# Patient Record
Sex: Female | Born: 1960 | Race: Black or African American | Hispanic: No | Marital: Single | Smoking: Former smoker
Health system: Southern US, Community
[De-identification: ages and names within clinical notes are randomized; demographics above are authoritative.]

## PROBLEM LIST (undated history)

## (undated) DIAGNOSIS — T7840XA Allergy, unspecified, initial encounter: Secondary | ICD-10-CM

## (undated) DIAGNOSIS — R87619 Unspecified abnormal cytological findings in specimens from cervix uteri: Secondary | ICD-10-CM

## (undated) DIAGNOSIS — R0789 Other chest pain: Secondary | ICD-10-CM

## (undated) DIAGNOSIS — M1990 Unspecified osteoarthritis, unspecified site: Secondary | ICD-10-CM

## (undated) DIAGNOSIS — I359 Nonrheumatic aortic valve disorder, unspecified: Secondary | ICD-10-CM

## (undated) DIAGNOSIS — R0602 Shortness of breath: Secondary | ICD-10-CM

## (undated) DIAGNOSIS — I341 Nonrheumatic mitral (valve) prolapse: Secondary | ICD-10-CM

## (undated) DIAGNOSIS — R002 Palpitations: Secondary | ICD-10-CM

## (undated) DIAGNOSIS — I10 Essential (primary) hypertension: Secondary | ICD-10-CM

## (undated) DIAGNOSIS — I351 Nonrheumatic aortic (valve) insufficiency: Secondary | ICD-10-CM

## (undated) DIAGNOSIS — IMO0002 Reserved for concepts with insufficient information to code with codable children: Secondary | ICD-10-CM

## (undated) DIAGNOSIS — E785 Hyperlipidemia, unspecified: Secondary | ICD-10-CM

## (undated) DIAGNOSIS — K5090 Crohn's disease, unspecified, without complications: Secondary | ICD-10-CM

## (undated) DIAGNOSIS — K589 Irritable bowel syndrome without diarrhea: Secondary | ICD-10-CM

## (undated) HISTORY — DX: Nonrheumatic mitral (valve) prolapse: I34.1

## (undated) HISTORY — DX: Allergy, unspecified, initial encounter: T78.40XA

## (undated) HISTORY — PX: LAPAROSCOPIC SUPRACERVICAL HYSTERECTOMY: SUR797

## (undated) HISTORY — DX: Reserved for concepts with insufficient information to code with codable children: IMO0002

## (undated) HISTORY — DX: Hyperlipidemia, unspecified: E78.5

## (undated) HISTORY — DX: Other chest pain: R07.89

## (undated) HISTORY — DX: Shortness of breath: R06.02

## (undated) HISTORY — DX: Nonrheumatic aortic valve disorder, unspecified: I35.9

## (undated) HISTORY — DX: Irritable bowel syndrome without diarrhea: K58.9

## (undated) HISTORY — DX: Unspecified osteoarthritis, unspecified site: M19.90

## (undated) HISTORY — DX: Nonrheumatic aortic (valve) insufficiency: I35.1

## (undated) HISTORY — PX: BLADDER SUSPENSION: SHX72

## (undated) HISTORY — DX: Crohn's disease, unspecified, without complications: K50.90

## (undated) HISTORY — DX: Essential (primary) hypertension: I10

## (undated) HISTORY — DX: Unspecified abnormal cytological findings in specimens from cervix uteri: R87.619

## (undated) HISTORY — PX: ABSCESS DRAINAGE: SHX1119

## (undated) HISTORY — DX: Palpitations: R00.2

## (undated) HISTORY — PX: VAGINAL HYSTERECTOMY: SUR661

---

## 1993-05-31 DIAGNOSIS — R87619 Unspecified abnormal cytological findings in specimens from cervix uteri: Secondary | ICD-10-CM

## 1993-05-31 HISTORY — DX: Unspecified abnormal cytological findings in specimens from cervix uteri: R87.619

## 2010-12-10 ENCOUNTER — Ambulatory Visit (HOSPITAL_COMMUNITY)
Admission: RE | Admit: 2010-12-10 | Discharge: 2010-12-10 | Disposition: A | Discharge status: Home / Self Care | Payer: Self-pay | Source: Ambulatory Visit | Attending: Internal Medicine | Admitting: Internal Medicine

## 2010-12-10 ENCOUNTER — Other Ambulatory Visit (HOSPITAL_COMMUNITY): Payer: Self-pay | Admitting: Internal Medicine

## 2010-12-10 DIAGNOSIS — R0789 Other chest pain: Secondary | ICD-10-CM | POA: Insufficient documentation

## 2010-12-10 DIAGNOSIS — R52 Pain, unspecified: Secondary | ICD-10-CM

## 2010-12-10 DIAGNOSIS — R0602 Shortness of breath: Secondary | ICD-10-CM | POA: Insufficient documentation

## 2010-12-10 DIAGNOSIS — R42 Dizziness and giddiness: Secondary | ICD-10-CM | POA: Insufficient documentation

## 2011-01-12 ENCOUNTER — Emergency Department (HOSPITAL_COMMUNITY)
Admission: EM | Admit: 2011-01-12 | Discharge: 2011-01-12 | Disposition: A | Discharge status: Home / Self Care | Payer: Self-pay | Attending: Emergency Medicine | Admitting: Emergency Medicine

## 2011-01-12 DIAGNOSIS — Z79899 Other long term (current) drug therapy: Secondary | ICD-10-CM | POA: Insufficient documentation

## 2011-01-12 DIAGNOSIS — K5090 Crohn's disease, unspecified, without complications: Secondary | ICD-10-CM | POA: Insufficient documentation

## 2011-01-12 DIAGNOSIS — I10 Essential (primary) hypertension: Secondary | ICD-10-CM | POA: Insufficient documentation

## 2011-01-12 DIAGNOSIS — N949 Unspecified condition associated with female genital organs and menstrual cycle: Secondary | ICD-10-CM | POA: Insufficient documentation

## 2011-01-12 DIAGNOSIS — N9489 Other specified conditions associated with female genital organs and menstrual cycle: Secondary | ICD-10-CM | POA: Insufficient documentation

## 2011-01-12 LAB — GLUCOSE, CAPILLARY: Glucose-Capillary: 85 mg/dL (ref 70–99)

## 2011-01-12 NOTE — Consult Note (Signed)
Reason for Consult:Left labial mass/swelling Referring Physician: ER  Carly Paul is an 50 y.o. female with a 10 day history of increasing tenderness and fullness just inside the vagina on the left.  Patient reports about 10 days ago she saw here PCP and was treated with Doxycycline.  She has completed the antibiotics and now the swelling is increasing again.  It is not red or hot to the touch like it was about 10 days ago.  It is also not nearly as tender as it was before.  She reports a history of bartholin's abscess in the past.  She has had at least two I&D's on this same side.    Pertinent Gynecological History: Menses: prior hysterecomy Bleeding: none Contraception: status post hysterectomy DES exposure: denies Blood transfusions: none Sexually transmitted diseases: denies Previous GYN Procedures: I&D of bartholin's abscess x 2 on the left  Last mammogram: unknown Date: unknown Last pap: not necessary due to prior hysterectomy Date: N/A    Menstrual History: Menarche age: unknown No LMP recorded. Patient has had a hysterectomy.    No past medical history on file.  Patient reports history of HTN and Crohn's disease  No past surgical history on file.  Patient reports hysterectomy.  Also reports having a bladder repair due to prolapse.  She has has several abscesses drained due to her Crohn's.  [place]  Until that time she was managed at [facility]  No family history on file.  NO gyn malignancies in family.  Social History:   Quit smoking 7 months ago  Allergies:  Sulfa  Medications: lisonopril  Review of Systems  Constitutional: Negative for fever and chills.  HENT: Negative for neck pain.   Eyes: Negative for blurred vision.  Respiratory: Negative for cough and shortness of breath.   Cardiovascular: Negative for chest pain.  Gastrointestinal: Positive for nausea and constipation. Negative for heartburn and vomiting.   Nausea is unrelated to current problem.  She has experienced this off and on for months.  ALso, constipation is not uncommon for patient.  Genitourinary: Negative for dysuria, urgency and frequency.  Musculoskeletal: Negative for myalgias.  Skin: Negative for rash.  Neurological: Negative for dizziness and headaches.  Endo/Heme/Allergies: Does not bruise/bleed easily.  Psychiatric/Behavioral: Negative for depression.    There were no vitals taken for this visit. Physical Exam  Constitutional: She is oriented to person, place, and time. She appears well-developed and well-nourished.  HENT:  Head: Normocephalic and atraumatic.  Neck: Normal range of motion.  Cardiovascular: Normal rate and regular rhythm.   Respiratory: Effort normal and breath sounds normal.  GI: Soft. Bowel sounds are normal. She exhibits no distension and no mass. There is no tenderness. There is no rebound and no guarding. Hernia confirmed negative in the right inguinal area and confirmed negative in the left inguinal area.  Genitourinary: Vagina normal.    There is no rash, tenderness, lesion or injury on the right labia. There is tenderness and lesion on the left labia. There is no rash or injury on the left labia. No tenderness or bleeding around the vagina. No foreign body around the vagina. No vaginal discharge found.       Location of bartholin's gland abscess is just inside left side of vagina.  There is now a labial hematoma on the left from the ER MD's attempt to I&D the abscess.  Absent cervix and uterus  Musculoskeletal: Normal range of motion. She exhibits no  edema.  Lymphadenopathy:       Right: No inguinal adenopathy present.       Left: No inguinal adenopathy present.  Neurological: She is alert and oriented to person, place, and time.  Skin: Skin is warm and dry.  Psychiatric: She has a normal mood and affect.    Patient was counseled about findings.  I&D recommended.  Risks including infection and  re-occurrence discussed.  Patient agreed to proceed.    Area cleansed with betadine x 3.  1/2 cc 2% Xylocaine instilled in lesion.  Aspiration performed and frank pus noted.  With no. 11 blade, bartholin's abscess opened.  Pus and blood drained.   Gland milked to ensure complete drainage.  No loculations noted with sterile Q tip.  Procedure ended.  Patient was uncomfortable with procedure but felt much better immediately after procedure ended.  Assessment/Plan: Bartholin's abscess s/p I&D in ER setting.  Left labial hematoma due to prior attempt at I&D from ER MD. 1.  Keflex 500mg  qid x 10 days. 2.  Vicodin 5/500 1-2 po q 4-6 hrs prn pain. 3.  Recheck in 48 hrs.  Will contact patient with follow-up information. 4.  Ice pack this afternoon for hematoma.  Then patient will start BID epson salt tub baths or warm compressed TID.  Kc Sedlak,M SUZANNE 01/12/2011

## 2011-03-12 ENCOUNTER — Ambulatory Visit (INDEPENDENT_AMBULATORY_CARE_PROVIDER_SITE_OTHER): Payer: Self-pay | Admitting: Advanced Practice Midwife

## 2011-03-12 ENCOUNTER — Encounter: Payer: Self-pay | Admitting: Advanced Practice Midwife

## 2011-03-12 DIAGNOSIS — N751 Abscess of Bartholin's gland: Secondary | ICD-10-CM

## 2011-03-12 DIAGNOSIS — K5090 Crohn's disease, unspecified, without complications: Secondary | ICD-10-CM

## 2011-03-12 NOTE — Progress Notes (Signed)
  Subjective:    Patient ID: Carly Paul, female    DOB: Feb 04, 1961, 50 y.o.   MRN: [id number]  HPI  Presents for consultation regarding recurrent Bartholins abscesses. She was seen by Dr Hyacinth Meeker in the ED for a recent I&D of a left abscess. Her last one was in 2009.   Her history is remarkable for Crohns Disease.  She has had several abscesses over the past years. She reports quick recovery after the most recent occurrence. Surgical history is remarkable for Hysterectomy and bladder repair. Previous care at [facility].  Does report some hot flashes and vaginal dryness recently .  There is some literature that suggests Bartholins may present in Crohns disease.  WikiOutlook.ch Http://www.[email or web address]://www.huang.com/ http://ukpmc.ac.uk/articles/PMC1901720/reload=0;jsessionid=FHNwGEaMBN3cT10fs3hxN.0  Review of Systems  Genitourinary: Negative for vaginal bleeding, vaginal discharge, difficulty urinating and vaginal pain.       Objective:   Physical Exam  Constitutional: She is oriented to person, place, and time. She appears well-developed and well-nourished.  HENT:  Head: Normocephalic.  Pulmonary/Chest: Effort normal.  Abdominal: Soft. She exhibits no distension. There is no tenderness. There is no rebound and no guarding.  Genitourinary: Vagina normal. No vaginal discharge found.       EGBUS:  No evidence of Bartholinitis. No Word Catheter used in recent drainage. Healed scar on left buttocks near vagina from abscess that drained itself..  Some atrophic changes and minimal lubrication of vagina. Uterus surgically absent. Pelvis nontender.  Musculoskeletal: Normal range of motion.  Neurological: She is alert and oriented to person, place, and time.  Skin: Skin is warm and dry.  Psychiatric: She has a normal mood and affect.          Assessment & Plan:  A:  Recurrent Bartholins Abscesses Crohns Disease Menopausal symptoms  P:   GC/Chlamydia sent per pt consent. Will schedule for MD consult regarding possible need for Marsupialization.  Info on procedure given to patient.

## 2011-03-12 NOTE — Patient Instructions (Signed)
Bartholin's Cyst And Abscess °You have a Bartholin's gland cyst. Bartholin's glands produce mucus through small openings just outside the opening of the vagina. The mucus helps with lubrication around the vagina during sexual intercourse. If the duct becomes clogged, the gland will swell and cause a bulge on the inside of the vagina. If this becomes big enough, it can be seen and felt on the outside of the vagina as well. Sometimes, the swelling will shrink away by itself. However, if the cyst becomes infected, the Bartholin's cyst fills with pus and becomes more swollen, red and painful and becomes a Bartholin's abscess. This usually requires antibiotic treatment and surgical drainage. Sometimes, with minor surgery under local anesthesia, a small tube is placed in the cyst or abscess wall. This allows continued drainage for up to 6 weeks. Minor surgery can make a new opening to replace the clogged duct and help prevent future cysts or abscess. °If the abscess occurs several times, a minor operation with local anesthesia is necessary to remove the Bartholin's gland completely or to make it drain better. Cutting open the gland and suturing the edges to make the opening of the gland bigger (marsupialization) may be needed and should usually be done by your obstetrician/gyncology physician. Antibiotics are usually prescribed for this condition. Take all antibiotics as prescribed. Make sure to finish them even if you are doing better. Take warm sitz baths for 20 minutes, 3 times a day. See your caregiver for follow-up care as recommended. °SEEK MEDICAL CARE IF: °· There is increasing pain, swelling and/or redness near the vagina.  °· There is vomiting or inability to tolerate medications.  °· You have an oral temperature above 102° F (38.9° C).  °· There is uncontrolled bleeding from the vagina.  °Document Released: 06/24/2004 Document Re-Released: 08/11/2009 °ExitCare® Patient Information ©2011 [employer]. °

## 2011-03-13 LAB — GC/CHLAMYDIA PROBE AMP, GENITAL
Chlamydia, DNA Probe: NEGATIVE
GC Probe Amp, Genital: NEGATIVE

## 2011-03-17 ENCOUNTER — Telehealth: Payer: Self-pay | Admitting: *Deleted

## 2011-03-17 NOTE — Telephone Encounter (Signed)
Pt requesting test results from visit on 03/12/11

## 2011-03-19 NOTE — Telephone Encounter (Signed)
Called pt and left message that I was returning her call. Please leave a new nurse voice mail message if she would like [place] to leave the information on this vpice mail. Pt needs to be told that her GC/Chlamydia tests were negative.

## 2011-03-22 NOTE — Telephone Encounter (Signed)
Pt left message on 10/19 to nurse voice mail that she would like her test results mailed to her.

## 2011-05-07 ENCOUNTER — Encounter: Payer: Self-pay | Admitting: Obstetrics & Gynecology

## 2011-05-07 ENCOUNTER — Ambulatory Visit (INDEPENDENT_AMBULATORY_CARE_PROVIDER_SITE_OTHER): Payer: Self-pay | Admitting: Obstetrics & Gynecology

## 2011-05-07 DIAGNOSIS — N751 Abscess of Bartholin's gland: Secondary | ICD-10-CM

## 2011-05-07 DIAGNOSIS — Z1239 Encounter for other screening for malignant neoplasm of breast: Secondary | ICD-10-CM

## 2011-05-07 NOTE — Progress Notes (Signed)
Pt declines flu vaccine

## 2011-05-07 NOTE — Patient Instructions (Signed)
Bartholin's Cyst and Abscess Bartholin's glands produce mucus through small openings just outside the opening of the vagina. The mucus helps with lubrication around the vagina during sexual intercourse. If the duct becomes clogged, the gland will swell and cause a bulge on the inside of the vagina. If this becomes big enough, it can be seen and felt on the outside of the vagina as well. Sometimes, the swelling will shrink away by itself. However, if the cyst becomes infected, the Bartholin's cyst fills with pus and becomes more swollen, red and painful and becomes a Bartholin's abscess. This usually requires antibiotic treatment and surgical drainage. Sometimes, with minor surgery under local anesthesia, a small tube is placed in the cyst or abscess wall. This allows continued drainage for up to 6 weeks. Minor surgery can make a new opening to replace the clogged duct and help prevent future cysts or abscess. If the abscess occurs several times, a minor operation with local anesthesia is necessary to remove the Bartholin's gland completely or to make it drain better. Cutting open the gland and suturing the edges to make the opening of the gland bigger (marsupialization) may be needed and should usually be done by your obstetrician-gyncology physician. Antibiotics are usually prescribed for this condition. Take all antibiotics as prescribed. Make sure to finish them even if you are doing better. Take warm sitz baths for 20 minutes, 3 times a day. See your caregiver for follow-up care as recommended. SEEK MEDICAL CARE IF:   You have increasing pain, swelling, or redness near the vagina.   You have vomiting or inability to tolerate medicines.   You have a fever.   You have uncontrolled bleeding from the vagina.  Document Released: 06/24/2004 Document Revised: 01/27/2011 Document Reviewed: 06/27/2009 ExitCare Patient Information 2012 [employer]. 

## 2011-05-07 NOTE — Progress Notes (Signed)
[id number] No LMP recorded. Patient has had a hysterectomy. The patient has had recurrent Bartholin's gland abscesses. They have occurred about every one and a half to 2 years. She had drainage of a Bartholin's abscess in the MAU about 2 months ago. She has no symptoms today. I discussed with her the risk of recurrence. She has a recurrence then we will consider incision and drainage, marsupialization, or ablation. She states that she has recurrent "boils" in her buttocks area and she's also had these under her arm and on her extremities. She has been told this might be a immune system problem. She has not been told that this was hidradenitis suppurativa. I suspect that this is the correct diagnosis. She goes to [facility] and has an internal medicine physician. She will followup with her physician soon. She states that she is due for a mammogram. I will schedule that. She should report a she has a recurrent Bartholin's abscess.  Dr. Scheryl Darter 05/07/2011

## 2011-05-28 ENCOUNTER — Other Ambulatory Visit: Payer: Self-pay | Admitting: Obstetrics & Gynecology

## 2011-05-28 DIAGNOSIS — Z1231 Encounter for screening mammogram for malignant neoplasm of breast: Secondary | ICD-10-CM

## 2011-06-25 ENCOUNTER — Ambulatory Visit (HOSPITAL_COMMUNITY): Payer: Self-pay

## 2011-07-23 ENCOUNTER — Ambulatory Visit (HOSPITAL_COMMUNITY)
Admission: RE | Admit: 2011-07-23 | Discharge: 2011-07-23 | Disposition: A | Discharge status: Home / Self Care | Payer: Self-pay | Source: Ambulatory Visit | Attending: Obstetrics & Gynecology | Admitting: Obstetrics & Gynecology

## 2011-07-23 DIAGNOSIS — Z1231 Encounter for screening mammogram for malignant neoplasm of breast: Secondary | ICD-10-CM

## 2012-04-11 ENCOUNTER — Encounter: Payer: Self-pay | Admitting: Gastroenterology

## 2012-07-01 ENCOUNTER — Emergency Department (HOSPITAL_COMMUNITY): Payer: Medicaid Other

## 2012-07-01 ENCOUNTER — Emergency Department (HOSPITAL_COMMUNITY)
Admission: EM | Admit: 2012-07-01 | Discharge: 2012-07-02 | Disposition: A | Discharge status: Home / Self Care | Payer: Medicaid Other | Attending: Emergency Medicine | Admitting: Emergency Medicine

## 2012-07-01 ENCOUNTER — Encounter (HOSPITAL_COMMUNITY): Payer: Self-pay | Admitting: Emergency Medicine

## 2012-07-01 DIAGNOSIS — K5090 Crohn's disease, unspecified, without complications: Secondary | ICD-10-CM | POA: Insufficient documentation

## 2012-07-01 DIAGNOSIS — R0602 Shortness of breath: Secondary | ICD-10-CM | POA: Insufficient documentation

## 2012-07-01 DIAGNOSIS — R0789 Other chest pain: Secondary | ICD-10-CM | POA: Insufficient documentation

## 2012-07-01 DIAGNOSIS — Z8742 Personal history of other diseases of the female genital tract: Secondary | ICD-10-CM | POA: Insufficient documentation

## 2012-07-01 DIAGNOSIS — R42 Dizziness and giddiness: Secondary | ICD-10-CM | POA: Insufficient documentation

## 2012-07-01 DIAGNOSIS — I059 Rheumatic mitral valve disease, unspecified: Secondary | ICD-10-CM | POA: Insufficient documentation

## 2012-07-01 DIAGNOSIS — I10 Essential (primary) hypertension: Secondary | ICD-10-CM | POA: Insufficient documentation

## 2012-07-01 DIAGNOSIS — M1990 Unspecified osteoarthritis, unspecified site: Secondary | ICD-10-CM | POA: Insufficient documentation

## 2012-07-01 DIAGNOSIS — M810 Age-related osteoporosis without current pathological fracture: Secondary | ICD-10-CM | POA: Insufficient documentation

## 2012-07-01 DIAGNOSIS — Z8739 Personal history of other diseases of the musculoskeletal system and connective tissue: Secondary | ICD-10-CM | POA: Insufficient documentation

## 2012-07-01 DIAGNOSIS — I359 Nonrheumatic aortic valve disorder, unspecified: Secondary | ICD-10-CM | POA: Insufficient documentation

## 2012-07-01 DIAGNOSIS — Z79899 Other long term (current) drug therapy: Secondary | ICD-10-CM | POA: Insufficient documentation

## 2012-07-01 DIAGNOSIS — Z87891 Personal history of nicotine dependence: Secondary | ICD-10-CM | POA: Insufficient documentation

## 2012-07-01 LAB — CBC WITH DIFFERENTIAL/PLATELET
Basophils Absolute: 0 10*3/uL (ref 0.0–0.1)
Basophils Relative: 0 % (ref 0–1)
Eosinophils Absolute: 0.1 10*3/uL (ref 0.0–0.7)
Eosinophils Relative: 1 % (ref 0–5)
HCT: 41.8 % (ref 36.0–46.0)
Hemoglobin: 13.8 g/dL (ref 12.0–15.0)
Lymphocytes Relative: 30 % (ref 12–46)
Lymphs Abs: 3.2 10*3/uL (ref 0.7–4.0)
MCH: 28.1 pg (ref 26.0–34.0)
MCHC: 33 g/dL (ref 30.0–36.0)
MCV: 85.1 fL (ref 78.0–100.0)
Monocytes Absolute: 0.7 10*3/uL (ref 0.1–1.0)
Monocytes Relative: 7 % (ref 3–12)
Neutro Abs: 6.6 10*3/uL (ref 1.7–7.7)
Neutrophils Relative %: 62 % (ref 43–77)
Platelets: 300 10*3/uL (ref 150–400)
RBC: 4.91 MIL/uL (ref 3.87–5.11)
RDW: 14.4 % (ref 11.5–15.5)
WBC: 10.7 10*3/uL — ABNORMAL HIGH (ref 4.0–10.5)

## 2012-07-01 LAB — POCT I-STAT, CHEM 8
BUN: 25 mg/dL — ABNORMAL HIGH (ref 6–23)
Calcium, Ion: 1.13 mmol/L (ref 1.12–1.23)
Chloride: 99 mEq/L (ref 96–112)
Creatinine, Ser: 1.1 mg/dL (ref 0.50–1.10)
Glucose, Bld: 117 mg/dL — ABNORMAL HIGH (ref 70–99)
HCT: 43 % (ref 36.0–46.0)
Hemoglobin: 14.6 g/dL (ref 12.0–15.0)
Potassium: 3.5 mEq/L (ref 3.5–5.1)
Sodium: 139 mEq/L (ref 135–145)
TCO2: 31 mmol/L (ref 0–100)

## 2012-07-01 LAB — POCT I-STAT TROPONIN I
Troponin i, poc: 0 ng/mL (ref 0.00–0.08)
Troponin i, poc: 0 ng/mL (ref 0.00–0.08)

## 2012-07-01 LAB — BASIC METABOLIC PANEL
BUN: 23 mg/dL (ref 6–23)
CO2: 30 mEq/L (ref 19–32)
Calcium: 8.9 mg/dL (ref 8.4–10.5)
Chloride: 96 mEq/L (ref 96–112)
Creatinine, Ser: 1 mg/dL (ref 0.50–1.10)
GFR calc Af Amer: 74 mL/min — ABNORMAL LOW (ref >90)
GFR calc non Af Amer: 64 mL/min — ABNORMAL LOW (ref >90)
Glucose, Bld: 127 mg/dL — ABNORMAL HIGH (ref 70–99)
Potassium: 3.2 mEq/L — ABNORMAL LOW (ref 3.5–5.1)
Sodium: 136 mEq/L (ref 135–145)

## 2012-07-01 LAB — CBC
HCT: 41.6 % (ref 36.0–46.0)
Hemoglobin: 13.9 g/dL (ref 12.0–15.0)
MCH: 28.2 pg (ref 26.0–34.0)
MCHC: 33.4 g/dL (ref 30.0–36.0)
MCV: 84.4 fL (ref 78.0–100.0)
Platelets: 323 10*3/uL (ref 150–400)
RBC: 4.93 MIL/uL (ref 3.87–5.11)
RDW: 14.4 % (ref 11.5–15.5)
WBC: 11.1 10*3/uL — ABNORMAL HIGH (ref 4.0–10.5)

## 2012-07-01 NOTE — ED Provider Notes (Signed)
History     CSN: [id number]  Arrival date & time 07/01/12  4098   First MD Initiated Contact with Patient 07/01/12 2033      Chief Complaint  Patient presents with  . Chest Pain    (Consider location/radiation/quality/duration/timing/severity/associated sxs/prior treatment) HPI Complains of anterior chest pain described as a heaviness while at rest onset 6 PM tonight. Symptoms accompanied by lightheadedness. Shortness of breath. No nausea no sweatiness. Symptoms resolved spontaneously just prior to arrival here. Nothing made symptoms better or worse. No treatment prior to coming here. Past Medical History  Diagnosis Date  . Allergy     sulfa and eggs  . Mitral valve prolapse   . Aortic insufficiency   . Arthritis     osteoarthritis  . Osteoporosis   . Crohn's disease   . Herniated disc   . Hypertension   . Abnormal Pap smear of cervix 1995    had cryo done    Past Surgical History  Procedure Date  . Laparoscopic supracervical hysterectomy   . Abdominal hysterectomy     Family History  Problem Relation Age of Onset  . Diabetes Paternal Grandmother   . Heart disease Paternal Grandmother   . Kidney disease Paternal Grandmother   . Diabetes Paternal Grandfather   . Heart disease Paternal Grandfather     History  Substance Use Topics  . Smoking status: Former Smoker -- 1 years    Types: Cigarettes    Quit date: 07/12/2010  . Smokeless tobacco: Never Used  . Alcohol Use: No    OB History    Grav Para Term Preterm Abortions TAB SAB Ect Mult Living   3 2 2  1  1   2       Review of Systems  Constitutional: Negative.   HENT: Negative.   Respiratory: Positive for shortness of breath.   Cardiovascular: Positive for chest pain.  Gastrointestinal: Positive for abdominal pain and diarrhea.       Patient treated for colitis approximately one week ago with steroids and antibiotics. Symptoms are improving steadily with time  Musculoskeletal: Negative.   Skin:  Negative.   Neurological: Negative.   Hematological: Negative.   Psychiatric/Behavioral: Negative.   All other systems reviewed and are negative.    Allergies  Sulfa antibiotics and Eggs or egg-derived products  Home Medications   Current Outpatient Rx  Name  Route  Sig  Dispense  Refill  . LISINOPRIL-HYDROCHLOROTHIAZIDE 20-12.5 MG PO TABS   Oral   Take 1 tablet by mouth daily.           . TRAMADOL HCL 50 MG PO TABS   Oral   Take 50 mg by mouth 2 (two) times daily.           BP 147/72  Pulse 91  Resp 23  SpO2 100%  Physical Exam  Nursing note and vitals reviewed. Constitutional: She appears well-developed and well-nourished.  HENT:  Head: Normocephalic and atraumatic.  Eyes: Conjunctivae normal are normal. Pupils are equal, round, and reactive to light.  Neck: Neck supple. No tracheal deviation present. No thyromegaly present.  Cardiovascular: Normal rate and regular rhythm.   No murmur heard. Pulmonary/Chest: Effort normal and breath sounds normal.  Abdominal: Soft. Bowel sounds are normal. She exhibits no distension. There is no tenderness.       obese  Musculoskeletal: Normal range of motion. She exhibits no edema and no tenderness.  Neurological: She is alert. Coordination normal.  Gait normal. She did not become lightheaded on standing  Skin: Skin is warm and dry. No rash noted.  Psychiatric: She has a normal mood and affect.    ED Course  Procedures (including critical care time)  Labs Reviewed  CBC - Abnormal; Notable for the following:    WBC 11.1 (*)     All other components within normal limits  BASIC METABOLIC PANEL - Abnormal; Notable for the following:    Potassium 3.2 (*)     Glucose, Bld 127 (*)     GFR calc non Af Amer 64 (*)     GFR calc Af Amer 74 (*)     All other components within normal limits  POCT I-STAT TROPONIN I  CBC WITH DIFFERENTIAL   No results found.   No diagnosis found.   Date: 07/01/2012  Rate: 80   Rhythm: normal sinus rhythm  QRS Axis: normal  Intervals: normal  ST/T Wave abnormalities: normal  Conduction Disutrbances: none  Narrative Interpretation: unremarkable  No priorEKG for comparison  Chest x-ray reviewed by me Results for orders placed during the hospital encounter of 07/01/12  CBC      Component Value Range   WBC 11.1 (*) 4.0 - 10.5 K/uL   RBC 4.93  3.87 - 5.11 MIL/uL   Hemoglobin 13.9  12.0 - 15.0 g/dL   HCT 96.0  45.4 - 09.8 %   MCV 84.4  78.0 - 100.0 fL   MCH 28.2  26.0 - 34.0 pg   MCHC 33.4  30.0 - 36.0 g/dL   RDW 11.9  14.7 - 82.9 %   Platelets 323  150 - 400 K/uL  BASIC METABOLIC PANEL      Component Value Range   Sodium 136  135 - 145 mEq/L   Potassium 3.2 (*) 3.5 - 5.1 mEq/L   Chloride 96  96 - 112 mEq/L   CO2 30  19 - 32 mEq/L   Glucose, Bld 127 (*) 70 - 99 mg/dL   BUN 23  6 - 23 mg/dL   Creatinine, Ser 5.62  0.50 - 1.10 mg/dL   Calcium 8.9  8.4 - 13.0 mg/dL   GFR calc non Af Amer 64 (*) >90 mL/min   GFR calc Af Amer 74 (*) >90 mL/min  POCT I-STAT TROPONIN I      Component Value Range   Troponin i, poc 0.00  0.00 - 0.08 ng/mL   Comment 3           CBC WITH DIFFERENTIAL      Component Value Range   WBC 10.7 (*) 4.0 - 10.5 K/uL   RBC 4.91  3.87 - 5.11 MIL/uL   Hemoglobin 13.8  12.0 - 15.0 g/dL   HCT 86.5  78.4 - 69.6 %   MCV 85.1  78.0 - 100.0 fL   MCH 28.1  26.0 - 34.0 pg   MCHC 33.0  30.0 - 36.0 g/dL   RDW 29.5  28.4 - 13.2 %   Platelets 300  150 - 400 K/uL   Neutrophils Relative 62  43 - 77 %   Neutro Abs 6.6  1.7 - 7.7 K/uL   Lymphocytes Relative 30  12 - 46 %   Lymphs Abs 3.2  0.7 - 4.0 K/uL   Monocytes Relative 7  3 - 12 %   Monocytes Absolute 0.7  0.1 - 1.0 K/uL   Eosinophils Relative 1  0 - 5 %   Eosinophils Absolute 0.1  0.0 -  0.7 K/uL   Basophils Relative 0  0 - 1 %   Basophils Absolute 0.0  0.0 - 0.1 K/uL  POCT I-STAT, CHEM 8      Component Value Range   Sodium 139  135 - 145 mEq/L   Potassium 3.5  3.5 - 5.1 mEq/L    Chloride 99  96 - 112 mEq/L   BUN 25 (*) 6 - 23 mg/dL   Creatinine, Ser 1.61  0.50 - 1.10 mg/dL   Glucose, Bld 096 (*) 70 - 99 mg/dL   Calcium, Ion 0.45  4.09 - 1.23 mmol/L   TCO2 31  0 - 100 mmol/L   Hemoglobin 14.6  12.0 - 15.0 g/dL   HCT 81.1  91.4 - 78.2 %  POCT I-STAT TROPONIN I      Component Value Range   Troponin i, poc 0.00  0.00 - 0.08 ng/mL   Comment 3           POCT I-STAT TROPONIN I      Component Value Range   Troponin i, poc 0.00  0.00 - 0.08 ng/mL   Comment 3            Dg Chest 2 View  07/01/2012  *RADIOLOGY REPORT*  Clinical Data: Chest pain and shortness of breath.  CHEST - 2 VIEW  Comparison: 12/10/2010  Findings: The heart size and pulmonary vascularity are normal. The lungs appear clear and expanded without focal air space disease or consolidation. No blunting of the costophrenic angles.  No pneumothorax.  Mediastinal contours appear intact.  No significant changes since the previous study.  IMPRESSION: No evidence of active pulmonary disease.   Original Report Authenticated By: Burman Nieves, M.D.     12:20 PM patient remains asymptomatic except for a few episodes of chest pain lasting for a split second during her emergency department course MDM  Strongly doubt acute coronary syndrome given the patient's symptoms, normal EKG, negative serial cardiac markers. Case discussed with Dr.Hochrein Plan outpatient cardiac workup next week Diagnosis atypical chest pain        Doug Sou, MD 07/02/12 [phone]

## 2012-07-01 NOTE — ED Notes (Signed)
Reports just completed 6 day of steroid & antibiotics yesterday after being treated with crohn's disease diagnosed 2006. Today felt sick ie. Given nauseated, & weak all over & weak to stomach. Denies vomiting & diarrhea. Then today an hour & . Ago while watching T.V  experienced dizziness, cold/hot, stood up & felt like going to pass out, described pain as  heaviness 7-8/10 to her left chest area non-radiating, with sob.  Every 4 weeks seen @ [facility] for pain management due to arthritis. 6 days ago seen Dr. Roseanne Reno for Crohn's Disease.

## 2012-07-01 NOTE — ED Notes (Signed)
Pt states that she was sitting on the couch, watching tv about an hour ago, when her chest got heavy.  States this has happened before but she didn't want to take any chances.  States that she was SOB, dizzy, and lightheaded when it happened.  States she feels weak right now.

## 2012-07-01 NOTE — ED Notes (Signed)
Pt has history of Crohn's disease. States that she is having a flare up. Had mid epigastric pain earlier tonight, but denies it at this time. Does report bloody stool secondary to Crohn's Disease. Reports feeling weak and dizzy, which she states is how she feels when she has a flare up. Pt alert and oriented x 4, neuro intact.

## 2012-07-02 LAB — POCT I-STAT TROPONIN I: Troponin i, poc: 0 ng/mL (ref 0.00–0.08)

## 2012-07-10 ENCOUNTER — Ambulatory Visit (INDEPENDENT_AMBULATORY_CARE_PROVIDER_SITE_OTHER): Payer: Medicare Other | Admitting: Cardiology

## 2012-07-10 VITALS — BP 130/85 | HR 76 | Ht 67.0 in | Wt 199.0 lb

## 2012-07-10 DIAGNOSIS — R079 Chest pain, unspecified: Secondary | ICD-10-CM

## 2012-07-10 DIAGNOSIS — R002 Palpitations: Secondary | ICD-10-CM

## 2012-07-10 NOTE — Patient Instructions (Addendum)
The current medical regimen is effective;  continue present plan and medications.  Your physician has requested that you have an echocardiogram. Echocardiography is a painless test that uses sound waves to create images of your heart. It provides your doctor with information about the size and shape of your heart and how well your heart's chambers and valves are working. This procedure takes approximately one hour. There are no restrictions for this procedure.  Your physician has recommended that you wear an event monitor for 21 days. Event monitors are medical devices that record the heart's electrical activity. Doctors most often [place] these monitors to diagnose arrhythmias. Arrhythmias are problems with the speed or rhythm of the heartbeat. The monitor is a small, portable device. You can wear one while you do your normal daily activities. This is usually used to diagnose what is causing palpitations/syncope (passing out).  Your physician has requested that you have an exercise tolerance test. For further information please visit [email or web address] Please also follow instruction sheet, as given.  Follow up after all testing with Dr Antoine Poche.

## 2012-07-10 NOTE — Progress Notes (Signed)
HPI The patient presents for evaluation of chest discomfort. This has been going on for several weeks.  She says it comes and goes.  Describes at rest. She doesn't bring on with activity. She describes some sharp discomfort. She has lots of associated symptoms such as not see a but no vomiting, chills, diaphoresis, mild shortness of breath. It goes away spontaneously but may last several minutes. She also describes tachypalpitations. She thinks these happen a couple of times per month.  She describes shortness of breath with these, presyncope but no syncope. She did present to the emergency room on February 1 and I reviewed all of these records. She had no objective evidence of ischemia. Lab work was normal. EKG was unremarkable. The patient is relatively sedentary. She does describe some episodes of difficulty breathing at night. She sometimes sleeps propped up.  Allergies  Allergen Reactions  . Sulfa Antibiotics Swelling  . Eggs Or Egg-Derived Products Rash    Current Outpatient Prescriptions  Medication Sig Dispense Refill  . lisinopril-hydrochlorothiazide (PRINZIDE,ZESTORETIC) 20-12.5 MG per tablet Take 1 tablet by mouth daily.        Marland Kitchen lovastatin (MEVACOR) 20 MG tablet Take 20 mg by mouth at bedtime.      . meloxicam (MOBIC) 15 MG tablet Take 15 mg by mouth daily.      . traMADol (ULTRAM) 50 MG tablet Take 50 mg by mouth 2 (two) times daily.       No current facility-administered medications for this visit.    Past Medical History  Diagnosis Date  . Allergy     sulfa and eggs  . Mitral valve prolapse   . Aortic insufficiency   . Arthritis     osteoarthritis  . Osteoporosis   . Crohn's disease   . Herniated disc   . Hypertension   . Abnormal Pap smear of cervix 1995    had cryo done    Past Surgical History  Procedure Laterality Date  . Laparoscopic supracervical hysterectomy    . Abdominal hysterectomy      Family History  Problem Relation Age of Onset  . Diabetes  Paternal Grandmother   . Heart disease Paternal Grandmother   . Kidney disease Paternal Grandmother   . Diabetes Paternal Grandfather   . Heart disease Paternal Grandfather     History   Social History  . Marital Status: Single    Spouse Name: N/A    Number of Children: N/A  . Years of Education: N/A   Occupational History  . Not on file.   Social History Main Topics  . Smoking status: Former Smoker -- 1 years    Types: Cigarettes    Quit date: 07/12/2010  . Smokeless tobacco: Never Used  . Alcohol Use: No  . Drug Use: No  . Sexually Active: Not Currently    Birth Control/ Protection: Surgical   Other Topics Concern  . Not on file   Social History Narrative  . No narrative on file    ROS:  Positive for arthritis, asthma, diarrhea, constipation, colitis. Otherwise as stated in the HPI and negative for all other systems.   PHYSICAL EXAM BP 130/85  Pulse 76  Ht 5\' 7"  (1.702 m)  Wt 199 lb (90.266 kg)  BMI 31.16 kg/m2 GENERAL:  Well appearing HEENT:  Pupils equal round and reactive, fundi not visualized, oral mucosa unremarkable NECK:  No jugular venous distention, waveform within normal limits, carotid upstroke brisk and symmetric, no bruits, no thyromegaly LYMPHATICS:  No cervical, inguinal adenopathy LUNGS:  Clear to auscultation bilaterally BACK:  No CVA tenderness CHEST:  Unremarkable HEART:  PMI not displaced or sustained,S1 and S2 within normal limits, no S3, no S4, no clicks, no rubs, no murmurs ABD:  Flat, positive bowel sounds normal in frequency in pitch, no bruits, no rebound, no guarding, no midline pulsatile mass, no hepatomegaly, no splenomegaly EXT:  2 plus pulses throughout, no edema, no cyanosis no clubbing SKIN:  No rashes no nodules NEURO:  Cranial nerves II through XII grossly intact, motor grossly intact throughout [facility]:  Cognitively intact, oriented to person place and time   EKG:  2/2/4  Sinus rhythm, rate 86, axis within normal limits,  intervals within normal limits, no acute ST-T wave changes.  ASSESSMENT AND PLAN  Chest pain - This is atypical.  The pretest probability of obstructive coronary disease is low. I will bring the patient back for a POET (Plain Old Exercise Test). This will allow me to screen for obstructive coronary disease, risk stratify and very importantly provide a prescription for exercise.  MVP - I do not appreciate this on exam. I will evaluate her with an echocardiogram.  Palpitations - I will apply a 21 day event monitor. Management will be based on these results.

## 2012-07-19 ENCOUNTER — Encounter (INDEPENDENT_AMBULATORY_CARE_PROVIDER_SITE_OTHER): Payer: Self-pay

## 2012-07-19 ENCOUNTER — Ambulatory Visit (HOSPITAL_COMMUNITY): Payer: Self-pay | Attending: Cardiology | Admitting: Radiology

## 2012-07-19 ENCOUNTER — Telehealth: Payer: Self-pay | Admitting: *Deleted

## 2012-07-19 DIAGNOSIS — R079 Chest pain, unspecified: Secondary | ICD-10-CM

## 2012-07-19 DIAGNOSIS — I080 Rheumatic disorders of both mitral and aortic valves: Secondary | ICD-10-CM | POA: Insufficient documentation

## 2012-07-19 DIAGNOSIS — R002 Palpitations: Secondary | ICD-10-CM

## 2012-07-19 DIAGNOSIS — R072 Precordial pain: Secondary | ICD-10-CM | POA: Insufficient documentation

## 2012-07-19 NOTE — Progress Notes (Signed)
Echocardiogram performed.  

## 2012-07-19 NOTE — Telephone Encounter (Signed)
Pt was given [employer] application papers, and was enrolled for monitor to be mailed 07/19/12 TK

## 2012-07-26 ENCOUNTER — Other Ambulatory Visit (HOSPITAL_COMMUNITY): Payer: Self-pay | Admitting: Internal Medicine

## 2012-07-26 DIAGNOSIS — Z1231 Encounter for screening mammogram for malignant neoplasm of breast: Secondary | ICD-10-CM

## 2012-07-31 ENCOUNTER — Encounter: Payer: Self-pay | Admitting: Cardiology

## 2012-08-04 ENCOUNTER — Ambulatory Visit (HOSPITAL_COMMUNITY): Payer: Self-pay

## 2012-08-07 ENCOUNTER — Encounter: Payer: Self-pay | Admitting: Physician Assistant

## 2012-08-15 ENCOUNTER — Ambulatory Visit (HOSPITAL_COMMUNITY): Payer: Self-pay

## 2012-08-17 ENCOUNTER — Encounter: Payer: Self-pay | Admitting: Physician Assistant

## 2012-08-18 ENCOUNTER — Ambulatory Visit (HOSPITAL_COMMUNITY)
Admission: RE | Admit: 2012-08-18 | Discharge: 2012-08-18 | Disposition: A | Discharge status: Home / Self Care | Payer: Self-pay | Source: Ambulatory Visit | Attending: Internal Medicine | Admitting: Internal Medicine

## 2012-08-18 DIAGNOSIS — Z1231 Encounter for screening mammogram for malignant neoplasm of breast: Secondary | ICD-10-CM

## 2012-09-04 ENCOUNTER — Ambulatory Visit (INDEPENDENT_AMBULATORY_CARE_PROVIDER_SITE_OTHER): Payer: Medicare Other | Admitting: Physician Assistant

## 2012-09-04 DIAGNOSIS — R079 Chest pain, unspecified: Secondary | ICD-10-CM

## 2012-09-04 DIAGNOSIS — R002 Palpitations: Secondary | ICD-10-CM

## 2012-09-04 NOTE — Progress Notes (Signed)
Exercise Treadmill Test  Pre-Exercise Testing Evaluation Rhythm: normal sinus  Rate: 78                 Test  Exercise Tolerance Test Ordering MD: Angelina Sheriff, MD  Interpreting MD: Tereso Newcomer, PA-C  Unique Test No: 1  Treadmill:  1  Indication for ETT: chest pain - rule out ischemia  Contraindication to ETT: No   Stress Modality: exercise - treadmill  Cardiac Imaging Performed: non   Protocol: standard Bruce - maximal  Max BP:  162/90  Max MPHR (bpm):  169 85% MPR (bpm):  144  MPHR obtained (bpm):  115 % MPHR obtained:  68%  Reached 85% MPHR (min:sec):  n/a Total Exercise Time (min-sec):  1:56  Workload in METS:  1.8 Borg Scale: n/a  Reason ETT Terminated:  dizziness    ST Segment Analysis At Rest: normal ST segments - no evidence of significant ST depression With Exercise: no evidence of significant ST depression  Other Information Arrhythmia:  No Angina during ETT:  absent (0) Quality of ETT:  non-diagnostic  ETT Interpretation:  Non-diagnostic.  Comments: Test stopped early.  Patient became quite dizzy on the treadmill.  High risk of falling - so we stopped it.  She denies CP.  Rhythm is NSR.  Recent Echo normal aside from mild AI.  BP is also stable.  Recommendations: Reviewed with Dr. Rollene Rotunda. Arrange Lexiscan Myoview. Signed, Tereso Newcomer, PA-C  1:05 PM 09/04/2012

## 2012-09-04 NOTE — Addendum Note (Signed)
Addended byAlben Spittle, Lorin Picket T on: 09/04/2012 01:24 PM   Modules accepted: Level of Service

## 2012-09-12 ENCOUNTER — Ambulatory Visit (HOSPITAL_COMMUNITY): Payer: Medicaid Other | Attending: Cardiology | Admitting: Radiology

## 2012-09-12 VITALS — BP 118/69 | Ht 67.5 in | Wt 196.0 lb

## 2012-09-12 DIAGNOSIS — Z8249 Family history of ischemic heart disease and other diseases of the circulatory system: Secondary | ICD-10-CM | POA: Insufficient documentation

## 2012-09-12 DIAGNOSIS — I10 Essential (primary) hypertension: Secondary | ICD-10-CM | POA: Insufficient documentation

## 2012-09-12 DIAGNOSIS — Z87891 Personal history of nicotine dependence: Secondary | ICD-10-CM | POA: Insufficient documentation

## 2012-09-12 DIAGNOSIS — R110 Nausea: Secondary | ICD-10-CM | POA: Insufficient documentation

## 2012-09-12 DIAGNOSIS — R002 Palpitations: Secondary | ICD-10-CM | POA: Insufficient documentation

## 2012-09-12 DIAGNOSIS — R61 Generalized hyperhidrosis: Secondary | ICD-10-CM | POA: Insufficient documentation

## 2012-09-12 DIAGNOSIS — R0602 Shortness of breath: Secondary | ICD-10-CM

## 2012-09-12 DIAGNOSIS — R079 Chest pain, unspecified: Secondary | ICD-10-CM

## 2012-09-12 DIAGNOSIS — R42 Dizziness and giddiness: Secondary | ICD-10-CM | POA: Insufficient documentation

## 2012-09-12 DIAGNOSIS — R55 Syncope and collapse: Secondary | ICD-10-CM | POA: Insufficient documentation

## 2012-09-12 MED ORDER — AMINOPHYLLINE 25 MG/ML IV SOLN
150.0000 mg | Freq: Once | INTRAVENOUS | Status: AC
  Administered 2012-09-12: 150 mg via INTRAVENOUS

## 2012-09-12 MED ORDER — TECHNETIUM TC 99M SESTAMIBI GENERIC - CARDIOLITE
33.0000 | Freq: Once | INTRAVENOUS | Status: AC | PRN
  Administered 2012-09-12: 33 via INTRAVENOUS

## 2012-09-12 MED ORDER — TECHNETIUM TC 99M SESTAMIBI GENERIC - CARDIOLITE
11.0000 | Freq: Once | INTRAVENOUS | Status: AC | PRN
  Administered 2012-09-12: 11 via INTRAVENOUS

## 2012-09-12 MED ORDER — REGADENOSON 0.4 MG/5ML IV SOLN
0.4000 mg | Freq: Once | INTRAVENOUS | Status: AC
  Administered 2012-09-12: 0.4 mg via INTRAVENOUS

## 2012-09-12 NOTE — Progress Notes (Signed)
[facility] SITE 3 NUCLEAR MED [address] [place], [place] [address] [phone]    Cardiology Nuclear Med Study  Carly Paul is a 52 y.o. female     MRN : [id number]     DOB: 1960/12/23  Procedure Date: 09/12/2012  Nuclear Med Background Indication for Stress Test:  Evaluation for Ischemia History:  07/19/2012 ECHO: EF: 55-60%, 09/04/2012 GXT: N/O Dx had to be stopped due to dizziness, unable to reach target HR  Cardiac Risk Factors: Family History - CAD, History of Smoking and Hypertension  Symptoms:  Chest Pain, Diaphoresis, Dizziness, Nausea, Near Syncope, Palpitations and SOB   Nuclear Pre-Procedure Caffeine/Decaff Intake:  None > 12 HRS NPO After: 6:00PM   Lungs:  clear O2 Sat: 98% on room air. IV 0.9% NS with Angio Cath:  22g  IV Site: R Antecubital x 1, tolerated well IV Started by:  Irean Hong, RN  Chest Size (in):  40 Cup Size: DD  Height: 5' 7.5" (1.715 m)  Weight:  196 lb (88.905 kg)  BMI:  Body mass index is 30.23 kg/(m^2). Tech Comments:  Took Prinzide this am. This patient started as a walking Lexiscan and as soon as it was given she had LOC changes, her legs gave out, and she was  Unable to continue. The test was completed with the patient sitting. She received a total of 150 mg of Aminophylline IV due to weakness, dizziness, and headache. Pictures checked without any preliminary problems.    Nuclear Med Study 1 or 2 day study: 1 day  Stress Test Type:  Treadmill/Lexiscan  Reading MD: Marca Ancona, MD  Order Authorizing Provider:  Rollene Rotunda, MD, and Tereso Newcomer, [facility]  Resting Radionuclide: Technetium 21m Sestamibi  Resting Radionuclide Dose: 11.0 mCi   Stress Radionuclide:  Technetium 14m Sestamibi  Stress Radionuclide Dose: 33.0 mCi           Stress Protocol Rest HR: 59 Stress HR: 139  Rest BP: 118/69 Stress BP: 175/78  Exercise Time (min): n/a METS: n/a   Predicted Max HR: 169 bpm % Max HR: 82.25 bpm Rate Pressure Product:  21308   Dose of Adenosine (mg):  n/a Dose of Lexiscan: 0.4 mg  Dose of Atropine (mg): n/a Dose of Dobutamine: n/a mcg/kg/min (at max HR)  Stress Test Technologist: Milana Na, EMT-P  Nuclear Technologist:  Domenic Polite, CNMT     Rest Procedure:  Myocardial perfusion imaging was performed at rest 45 minutes following the intravenous administration of Technetium 79m Sestamibi. Rest ECG: NSR - Normal EKG  Stress Procedure:  The patient received IV Lexiscan 0.4 mg over 15-seconds with concurrent low level exercise and then Technetium 32m Sestamibi was injected at 30-seconds while the patient continued walking one more minute. This patient started as a walking Lexiscan but had to be stopped and moved to the recliner. Her legs gave out and she had an unstable LOC. Quantitative spect images were obtained after a 45-minute delay. Stress ECG: No significant change from baseline ECG  QPS Raw Data Images:  Normal; no motion artifact; normal heart/lung ratio. Stress Images:  Small, mild mid to apical anterior perfusion defect. Rest Images:  Small, mild mid to apical anterior perfusion defect.  Subtraction (SDS):  Small, mild fixed mid to apical anterior perfusion defect.  Transient Ischemic Dilatation (Normal <1.22):  1.05 Lung/Heart Ratio (Normal <0.45):  0.57  Quantitative Gated Spect Images QGS EDV:  89 ml QGS ESV:  32 ml  Impression Exercise Capacity:  [employer]  with low level exercise. BP Response:  Hypotensive blood pressure response. Clinical Symptoms:  Short of breath.  ECG Impression:  No significant ST segment change suggestive of ischemia. Comparison with Prior Nuclear Study: No images to compare  Overall Impression:  Low risk stress nuclear study. Small, mild mid to apical anterior perfusion defect was fixed.  There was prominent breast shadow and normal wall motion, so suspect this may represent breast attenuation.  No ischemia.  Significant hypotensive response to Lexiscan.    LV Ejection Fraction: 64%.  LV Wall Motion:  NL LV Function; NL Wall Motion  Marca Ancona 09/12/2012

## 2012-09-18 ENCOUNTER — Telehealth: Payer: Self-pay | Admitting: *Deleted

## 2012-09-18 ENCOUNTER — Encounter: Payer: Self-pay | Admitting: *Deleted

## 2012-09-18 NOTE — Telephone Encounter (Signed)
Message copied by Sharin Grave on Mon Sep 18, 2012  4:45 PM ------      Message from: Rollene Rotunda      Created: Sun Sep 17, 2012  2:22 PM       Pam,  Please call this patient.  Her stress test was OK and with this and the normal echo, no further work up is suggested.  ------

## 2012-09-18 NOTE — Telephone Encounter (Signed)
Pt aware as instructed.

## 2012-09-21 ENCOUNTER — Telehealth: Payer: Self-pay

## 2012-09-21 NOTE — Telephone Encounter (Signed)
notify patient of monitor results

## 2012-10-09 ENCOUNTER — Emergency Department (HOSPITAL_COMMUNITY)
Admission: EM | Admit: 2012-10-09 | Discharge: 2012-10-09 | Disposition: A | Discharge status: Home / Self Care | Payer: Medicaid Other | Attending: Emergency Medicine | Admitting: Emergency Medicine

## 2012-10-09 ENCOUNTER — Emergency Department (HOSPITAL_COMMUNITY): Payer: Medicaid Other

## 2012-10-09 ENCOUNTER — Encounter (HOSPITAL_COMMUNITY): Payer: Self-pay | Admitting: Emergency Medicine

## 2012-10-09 DIAGNOSIS — T50905A Adverse effect of unspecified drugs, medicaments and biological substances, initial encounter: Secondary | ICD-10-CM

## 2012-10-09 DIAGNOSIS — T50901A Poisoning by unspecified drugs, medicaments and biological substances, accidental (unintentional), initial encounter: Secondary | ICD-10-CM | POA: Insufficient documentation

## 2012-10-09 DIAGNOSIS — H538 Other visual disturbances: Secondary | ICD-10-CM | POA: Insufficient documentation

## 2012-10-09 DIAGNOSIS — Y929 Unspecified place or not applicable: Secondary | ICD-10-CM | POA: Insufficient documentation

## 2012-10-09 DIAGNOSIS — T887XXA Unspecified adverse effect of drug or medicament, initial encounter: Secondary | ICD-10-CM | POA: Insufficient documentation

## 2012-10-09 DIAGNOSIS — R21 Rash and other nonspecific skin eruption: Secondary | ICD-10-CM | POA: Insufficient documentation

## 2012-10-09 DIAGNOSIS — J309 Allergic rhinitis, unspecified: Secondary | ICD-10-CM

## 2012-10-09 DIAGNOSIS — Y939 Activity, unspecified: Secondary | ICD-10-CM | POA: Insufficient documentation

## 2012-10-09 LAB — POCT I-STAT, CHEM 8
BUN: 13 mg/dL (ref 6–23)
Calcium, Ion: 1.03 mmol/L — ABNORMAL LOW (ref 1.12–1.23)
Chloride: 108 mEq/L (ref 96–112)
Creatinine, Ser: 1 mg/dL (ref 0.50–1.10)
Glucose, Bld: 85 mg/dL (ref 70–99)
HCT: 41 % (ref 36.0–46.0)
Hemoglobin: 13.9 g/dL (ref 12.0–15.0)
Potassium: 3.1 mEq/L — ABNORMAL LOW (ref 3.5–5.1)
Sodium: 143 mEq/L (ref 135–145)
TCO2: 28 mmol/L (ref 0–100)

## 2012-10-09 MED ORDER — PREDNISONE 20 MG PO TABS
60.0000 mg | ORAL_TABLET | Freq: Once | ORAL | Status: AC
  Administered 2012-10-09: 60 mg via ORAL
  Filled 2012-10-09: qty 3

## 2012-10-09 MED ORDER — DIPHENHYDRAMINE HCL 50 MG/ML IJ SOLN
25.0000 mg | Freq: Once | INTRAMUSCULAR | Status: AC
  Administered 2012-10-09: 25 mg via INTRAVENOUS
  Filled 2012-10-09: qty 1

## 2012-10-09 MED ORDER — ALBUTEROL SULFATE (5 MG/ML) 0.5% IN NEBU
5.0000 mg | INHALATION_SOLUTION | Freq: Once | RESPIRATORY_TRACT | Status: AC
  Administered 2012-10-09: 5 mg via RESPIRATORY_TRACT
  Filled 2012-10-09: qty 1

## 2012-10-09 MED ORDER — FLUTICASONE PROPIONATE 50 MCG/ACT NA SUSP: 2.0000 | Freq: Every day | NASAL | Status: DC

## 2012-10-09 MED ORDER — LORAZEPAM 2 MG/ML IJ SOLN
0.5000 mg | Freq: Once | INTRAMUSCULAR | Status: AC
  Administered 2012-10-09: 0.5 mg via INTRAVENOUS
  Filled 2012-10-09: qty 1

## 2012-10-09 NOTE — ED Notes (Signed)
Pt reports congestion, cough, and blurred vision "after cleaning with cleaning supplies "

## 2012-10-09 NOTE — ED Provider Notes (Signed)
History    This chart was scribed for non-physician practitioner Ebbie Ridge, PA-C working with Gavin Pound. Oletta Lamas, MD by Gerlean Ren, ED Scribe. This patient was seen in room WTR5/WTR5 and the patient's care was started at 3:05 PM.    CSN: [id number]  Arrival date & time 10/09/12  1317   None     Chief Complaint  Patient presents with  . Nasal Congestion  . Rash  . Blurred Vision    exposed to cleanin chemicals     The history is provided by the patient. No language interpreter was used.  Carly Paul is a 52 y.o. female with h/o Crohn's disease and HTN who presents to the Emergency Department complaining of blurred vision described as a "grayish film over both eyes" with associated nasal congestion, rash over face, cough, mild wheezing, rhinorrhea, mild SOB, diaphoresis, and emesis with mucous but no food present after cleaning house with Clorox/water mixture this morning.  Pt reports blurred vision is main concern at this time.  Pt states she is unsure if she splashed any of the fluid on herself.  Pt denies LOC, HA, epistaxis, chest pain.  Pt states she frequently cleans with same fluids and has never had similar reaction.  Pt stepped outside to get air when symptoms began and states it did not help.   Past Medical History  Diagnosis Date  . Allergy     sulfa and eggs  . Mitral valve prolapse   . Aortic insufficiency   . Arthritis     osteoarthritis  . Osteoporosis   . Crohn's disease   . Herniated disc   . Hypertension   . Abnormal Pap smear of cervix 1995    had cryo done    Past Surgical History  Procedure Laterality Date  . Laparoscopic supracervical hysterectomy    . Vaginal hysterectomy    . Abscess drainage      GI  . Bladder suspension      Family History  Problem Relation Age of Onset  . Scleroderma Maternal Grandmother   . Heart disease Paternal Grandfather     Unsure of details  . Kidney disease Paternal Grandmother   . Diabetes Paternal Grandfather    . Heart disease Maternal Grandfather     CHF    History  Substance Use Topics  . Smoking status: Former Smoker -- 1 years    Types: Cigarettes    Quit date: 07/12/2010  . Smokeless tobacco: Never Used  . Alcohol Use: No    OB History   Grav Para Term Preterm Abortions TAB SAB Ect Mult Living   3 2 2  1  1   2       Review of Systems A complete 10 system review of systems was obtained and all systems are negative except as noted in the HPI and PMH.   Allergies  Sulfa antibiotics and Eggs or egg-derived products  Home Medications   Current Outpatient Rx  Name  Route  Sig  Dispense  Refill  . lisinopril-hydrochlorothiazide (PRINZIDE,ZESTORETIC) 20-12.5 MG per tablet   Oral   Take 1 tablet by mouth daily.           Marland Kitchen lovastatin (MEVACOR) 20 MG tablet   Oral   Take 20 mg by mouth at bedtime.         . meloxicam (MOBIC) 15 MG tablet   Oral   Take 15 mg by mouth daily.         Marland Kitchen  traMADol (ULTRAM) 50 MG tablet   Oral   Take 50 mg by mouth 2 (two) times daily.           BP 149/70  Pulse 84  Temp(Src) 98.8 F (37.1 C) (Oral)  Resp 18  SpO2 100%  Physical Exam  Nursing note and vitals reviewed. Constitutional: She is oriented to person, place, and time. She appears well-developed and well-nourished. No distress.  HENT:  Head: Normocephalic and atraumatic.  Eyes: EOM are normal.  Neck: Neck supple. No tracheal deviation present.  Cardiovascular: Normal rate, regular rhythm and normal heart sounds.   Pulmonary/Chest: Effort normal and breath sounds normal. No respiratory distress. She has no wheezes.  Abdominal: Soft. There is no tenderness.  Musculoskeletal: Normal range of motion.  Neurological: She is alert and oriented to person, place, and time.  Skin: Skin is warm and dry.  Psychiatric: She has a normal mood and affect. Her behavior is normal.    ED Course  Procedures (including critical care time) DIAGNOSTIC STUDIES: Oxygen Saturation is  100% on room air, normal by my interpretation.    COORDINATION OF CARE: 3:09 PM- Discussed chest XR and eye rinse with saline solution.  Pt verbalizes understanding and agrees.     Dg Chest 2 View  10/09/2012  *RADIOLOGY REPORT*  Clinical Data: Gas in relation.  CHEST - 2 VIEW  Comparison: 07/01/2012  Findings: The lungs are clear without focal consolidation, edema, effusion or pneumothorax.  Cardiopericardial silhouette is within normal limits for size.  Imaged bony structures of the thorax are intact.  IMPRESSION: Normal exam.   Original Report Authenticated By: Kennith Center, M.D.   Patient started having dramatic twitching of her body and was sent back to the main part of the emergency department where Dr.Ghim took over the patient's care is followed nebulized breathing treatment and prednisone.  Patient was observed  MDM  I personally performed the services described in this documentation, which was scribed in my presence. The recorded information has been reviewed and is accurate.   Carlyle Dolly, PA-C 10/19/12 1645

## 2012-10-09 NOTE — ED Notes (Addendum)
Slow irrigation of eyes initiated for chemical exposure. Pt assisting with procedure. Pt started to demostrate fine tremors at 1652. Pt remained alert and appropriate. VS WNL, with decreased HR for this pt. PA at at bedside. Pt transferred to Room A. Acute bed. EDP at bedside. Tremors increase in frequency. Pt remained oriented

## 2012-10-09 NOTE — ED Provider Notes (Addendum)
Medical screening examination/treatment/procedure(s) were conducted as a shared visit with non-physician practitioner(s) and myself.  I personally evaluated the patient during the encounter   Pt with sensation of throat and chest tightness, at home, was cleaning using diluted bleach, felt like she was exposed.  In the ED, some chest tightness seen by PA, given albuterol and prednisone for possibly reactive airway disease.  About 1 hour after treatments given, reportedly lungs improved, but then pt began having uncontrollable shaking episodes and difficulty forming words with mouth, remains alert, oriented and follows commands.  Complains of no HA, she reports she knows she is having episodes of shaking, but reports is unable to control them. Doubt seizures, possibly dystonic reaction, although unclear what from.  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  Pt reports a similar reaction to IV contrast years ago.    Plan is to check basic labs, treat with IV benadryl, ativan and monitor for improvement.  If doesn't improve, will consider neuro eval.    Gavin Pound. Khayree Delellis, MD 10/09/12 1726    8:57 PM K+ is slightly low at 3.1, likely incidental . Pt's symptoms nearly resolved after IV ativan given.  Discussed with pt, she feels improved, ok following up with PCP.  Will give flonase for her nasal rhinitis, likely allergies.    Gavin Pound. Oletta Lamas, MD 10/09/12 1610

## 2012-10-09 NOTE — Discharge Instructions (Signed)
Allergic Rhinitis  Allergic rhinitis is when the mucous membranes in the nose respond to allergens. Allergens are particles in the air that cause your body to have an allergic reaction. This causes you to release allergic antibodies. Through a chain of events, these eventually cause you to release histamine into the blood stream (hence the use of antihistamines). Although meant to be protective to the body, it is this release that causes your discomfort, such as frequent sneezing, congestion and an itchy runny nose.    CAUSES    The pollen allergens may come from grasses, trees, and weeds. This is seasonal allergic rhinitis, or "hay fever." Other allergens cause year-round allergic rhinitis (perennial allergic rhinitis) such as house dust mite allergen, pet dander and mold spores.    SYMPTOMS     Nasal stuffiness (congestion).   Runny, itchy nose with sneezing and tearing of the eyes.   There is often an itching of the mouth, eyes and ears.  It cannot be cured, but it can be controlled with medications.  DIAGNOSIS    If you are unable to determine the offending allergen, skin or blood testing may find it.  TREATMENT     Avoid the allergen.   Medications and allergy shots (immunotherapy) can help.   Hay fever may often be treated with antihistamines in pill or nasal spray forms. Antihistamines block the effects of histamine. There are over-the-counter medicines that may help with nasal congestion and swelling around the eyes. Check with your caregiver before taking or giving this medicine.  If the treatment above does not work, there are many new medications your caregiver can prescribe. Stronger medications may be used if initial measures are ineffective. Desensitizing injections can be used if medications and avoidance fails. Desensitization is when a patient is given ongoing shots until the body becomes less sensitive to the allergen. Make sure you follow up with your caregiver if problems continue.   SEEK MEDICAL CARE IF:     You develop fever (more than 100.5 F (38.1 C).   You develop a cough that does not stop easily (persistent).   You have shortness of breath.   You start wheezing.   Symptoms interfere with normal daily activities.  Document Released: 02/09/2001 Document Revised: 08/09/2011 Document Reviewed: 08/21/2008  [employer] Patient Information 2013 [employer].

## 2012-10-11 ENCOUNTER — Emergency Department (HOSPITAL_COMMUNITY)
Admission: EM | Admit: 2012-10-11 | Discharge: 2012-10-11 | Disposition: A | Discharge status: Home / Self Care | Payer: Medicare Other | Attending: Emergency Medicine | Admitting: Emergency Medicine

## 2012-10-11 ENCOUNTER — Emergency Department (HOSPITAL_COMMUNITY): Payer: Medicare Other

## 2012-10-11 ENCOUNTER — Encounter (HOSPITAL_COMMUNITY): Payer: Self-pay | Admitting: Emergency Medicine

## 2012-10-11 DIAGNOSIS — Z8739 Personal history of other diseases of the musculoskeletal system and connective tissue: Secondary | ICD-10-CM | POA: Insufficient documentation

## 2012-10-11 DIAGNOSIS — Z8719 Personal history of other diseases of the digestive system: Secondary | ICD-10-CM | POA: Insufficient documentation

## 2012-10-11 DIAGNOSIS — R4789 Other speech disturbances: Secondary | ICD-10-CM | POA: Insufficient documentation

## 2012-10-11 DIAGNOSIS — R5381 Other malaise: Secondary | ICD-10-CM | POA: Insufficient documentation

## 2012-10-11 DIAGNOSIS — IMO0002 Reserved for concepts with insufficient information to code with codable children: Secondary | ICD-10-CM | POA: Insufficient documentation

## 2012-10-11 DIAGNOSIS — Z7982 Long term (current) use of aspirin: Secondary | ICD-10-CM | POA: Insufficient documentation

## 2012-10-11 DIAGNOSIS — M1990 Unspecified osteoarthritis, unspecified site: Secondary | ICD-10-CM | POA: Insufficient documentation

## 2012-10-11 DIAGNOSIS — Z8679 Personal history of other diseases of the circulatory system: Secondary | ICD-10-CM | POA: Insufficient documentation

## 2012-10-11 DIAGNOSIS — Z79899 Other long term (current) drug therapy: Secondary | ICD-10-CM | POA: Insufficient documentation

## 2012-10-11 DIAGNOSIS — Z87891 Personal history of nicotine dependence: Secondary | ICD-10-CM | POA: Insufficient documentation

## 2012-10-11 DIAGNOSIS — R4781 Slurred speech: Secondary | ICD-10-CM

## 2012-10-11 DIAGNOSIS — R259 Unspecified abnormal involuntary movements: Secondary | ICD-10-CM | POA: Insufficient documentation

## 2012-10-11 DIAGNOSIS — I10 Essential (primary) hypertension: Secondary | ICD-10-CM | POA: Insufficient documentation

## 2012-10-11 DIAGNOSIS — R209 Unspecified disturbances of skin sensation: Secondary | ICD-10-CM | POA: Insufficient documentation

## 2012-10-11 DIAGNOSIS — R202 Paresthesia of skin: Secondary | ICD-10-CM

## 2012-10-11 LAB — PROTIME-INR
INR: 0.86 (ref 0.00–1.49)
Prothrombin Time: 11.7 seconds (ref 11.6–15.2)

## 2012-10-11 LAB — RAPID URINE DRUG SCREEN, HOSP PERFORMED
Amphetamines: NOT DETECTED
Barbiturates: NOT DETECTED
Benzodiazepines: NOT DETECTED
Cocaine: NOT DETECTED
Opiates: NOT DETECTED
Tetrahydrocannabinol: NOT DETECTED

## 2012-10-11 LAB — COMPREHENSIVE METABOLIC PANEL
ALT: 28 U/L (ref 0–35)
AST: 29 U/L (ref 0–37)
Albumin: 4.6 g/dL (ref 3.5–5.2)
Alkaline Phosphatase: 50 U/L (ref 39–117)
BUN: 13 mg/dL (ref 6–23)
CO2: 29 mEq/L (ref 19–32)
Calcium: 9.5 mg/dL (ref 8.4–10.5)
Chloride: 102 mEq/L (ref 96–112)
Creatinine, Ser: 1 mg/dL (ref 0.50–1.10)
GFR calc Af Amer: 74 mL/min — ABNORMAL LOW (ref >90)
GFR calc non Af Amer: 64 mL/min — ABNORMAL LOW (ref >90)
Glucose, Bld: 93 mg/dL (ref 70–99)
Potassium: 3.9 mEq/L (ref 3.5–5.1)
Sodium: 142 mEq/L (ref 135–145)
Total Bilirubin: 0.2 mg/dL — ABNORMAL LOW (ref 0.3–1.2)
Total Protein: 7.8 g/dL (ref 6.0–8.3)

## 2012-10-11 LAB — POCT I-STAT, CHEM 8
BUN: 15 mg/dL (ref 6–23)
Calcium, Ion: 1.06 mmol/L — ABNORMAL LOW (ref 1.12–1.23)
Chloride: 105 mEq/L (ref 96–112)
Creatinine, Ser: 1 mg/dL (ref 0.50–1.10)
Glucose, Bld: 95 mg/dL (ref 70–99)
HCT: 43 % (ref 36.0–46.0)
Hemoglobin: 14.6 g/dL (ref 12.0–15.0)
Potassium: 3.9 mEq/L (ref 3.5–5.1)
Sodium: 141 mEq/L (ref 135–145)
TCO2: 29 mmol/L (ref 0–100)

## 2012-10-11 LAB — POCT I-STAT TROPONIN I: Troponin i, poc: 0 ng/mL (ref 0.00–0.08)

## 2012-10-11 LAB — ETHANOL: Alcohol, Ethyl (B): <11 mg/dL (ref 0–11)

## 2012-10-11 LAB — DIFFERENTIAL
Basophils Absolute: 0 10*3/uL (ref 0.0–0.1)
Basophils Relative: 0 % (ref 0–1)
Eosinophils Absolute: 0.1 10*3/uL (ref 0.0–0.7)
Eosinophils Relative: 2 % (ref 0–5)
Lymphocytes Relative: 40 % (ref 12–46)
Lymphs Abs: 3.2 10*3/uL (ref 0.7–4.0)
Monocytes Absolute: 0.5 10*3/uL (ref 0.1–1.0)
Monocytes Relative: 7 % (ref 3–12)
Neutro Abs: 4.2 10*3/uL (ref 1.7–7.7)
Neutrophils Relative %: 52 % (ref 43–77)

## 2012-10-11 LAB — CBC
HCT: 39.7 % (ref 36.0–46.0)
Hemoglobin: 13 g/dL (ref 12.0–15.0)
MCH: 27.5 pg (ref 26.0–34.0)
MCHC: 32.7 g/dL (ref 30.0–36.0)
MCV: 84.1 fL (ref 78.0–100.0)
Platelets: 290 10*3/uL (ref 150–400)
RBC: 4.72 MIL/uL (ref 3.87–5.11)
RDW: 14.2 % (ref 11.5–15.5)
WBC: 8.1 10*3/uL (ref 4.0–10.5)

## 2012-10-11 LAB — URINALYSIS, ROUTINE W REFLEX MICROSCOPIC
Bilirubin Urine: NEGATIVE
Glucose, UA: NEGATIVE mg/dL
Hgb urine dipstick: NEGATIVE
Ketones, ur: NEGATIVE mg/dL
Leukocytes, UA: NEGATIVE
Nitrite: NEGATIVE
Protein, ur: NEGATIVE mg/dL
Specific Gravity, Urine: 1.009 (ref 1.005–1.030)
Urobilinogen, UA: 0.2 mg/dL (ref 0.0–1.0)
pH: 6.5 (ref 5.0–8.0)

## 2012-10-11 LAB — TROPONIN I: Troponin I: <0.3 ng/mL (ref <0.30)

## 2012-10-11 LAB — GLUCOSE, CAPILLARY: Glucose-Capillary: 91 mg/dL (ref 70–99)

## 2012-10-11 LAB — APTT: aPTT: 30 seconds (ref 24–37)

## 2012-10-11 MED ORDER — LORAZEPAM 1 MG PO TABS: 1.0000 mg | ORAL_TABLET | Freq: Three times a day (TID) | ORAL | Status: DC | PRN

## 2012-10-11 NOTE — ED Notes (Signed)
Pt states that couple hours ago pt started having slurred speech, weakness, trouble ambulating, muscle jerks/spasms.  Pt was here Monday night and was given ativan and steroids and similar symptoms occurred.

## 2012-10-11 NOTE — ED Notes (Signed)
Pt ambulated with steady gait around nurse's station and back to room.

## 2012-10-11 NOTE — ED Provider Notes (Signed)
History     CSN: [id number]  Arrival date & time 10/11/12  1431   First MD Initiated Contact with Patient 10/11/12 1440      Chief Complaint  Patient presents with  . muscle jerks   . Weakness  . Gait Problem    (Consider location/radiation/quality/duration/timing/severity/associated sxs/prior treatment) HPI Comments: Carly Paul is a 52 y.o. Female who presents for evaluation of slurred speech and a numb feeling on the top of her head. She also had difficulty walking. As the speech has resolved. She also felt some shakiness today. She is here with her husband, who confirms these findings. He was able to give some additional information. The patient was seen here in the ED, 2 days ago with "not feeling well." She also had a period of slurred speech, at that time. Since discharge from the ED, she has not had any slurred speech, blurred vision, headache, weakness, dizziness, nausea, vomiting, or walking problems, until today. Today, he talked to her at approximately 945 as he was leaving for a walk. That conversation was very brief. He did not notice any problems then. The patient reported to him later, at around 2:00 PM, that after he left the house at 9:45 AM, she began to feel funny again. She couldnot specify exactly what was wrong. Later, while she was sitting on the porch, at 2 PM he noticed that her speech was slurred. They then got in the car to drive around and get some air, and he became concerned about her speech so he brought her here to the emergency department. After arrival in the emergency department. The husband states that her speech has improved. He has not noticed any recent problems, with her. He does not think that she is under stress. There are no known modifying factors.  Patient is a 52 y.o. female presenting with weakness. The history is provided by the patient and the spouse.  Weakness    Past Medical History  Diagnosis Date  . Allergy     sulfa and eggs  .  Mitral valve prolapse   . Aortic insufficiency   . Arthritis     osteoarthritis  . Osteoporosis   . Crohn's disease   . Herniated disc   . Hypertension   . Abnormal Pap smear of cervix 1995    had cryo done    Past Surgical History  Procedure Laterality Date  . Laparoscopic supracervical hysterectomy    . Vaginal hysterectomy    . Abscess drainage      GI  . Bladder suspension      Family History  Problem Relation Age of Onset  . Scleroderma Maternal Grandmother   . Heart disease Paternal Grandfather     Unsure of details  . Kidney disease Paternal Grandmother   . Diabetes Paternal Grandfather   . Heart disease Maternal Grandfather     CHF    History  Substance Use Topics  . Smoking status: Former Smoker -- 1 years    Types: Cigarettes    Quit date: 07/12/2010  . Smokeless tobacco: Never Used  . Alcohol Use: No    OB History   Grav Para Term Preterm Abortions TAB SAB Ect Mult Living   3 2 2  1  1   2       Review of Systems  Neurological: Positive for weakness.  All other systems reviewed and are negative.    Allergies  Sulfa antibiotics and Eggs or egg-derived products  Home Medications  Current Outpatient Rx  Name  Route  Sig  Dispense  Refill  . aspirin-sod bicarb-citric acid (ALKA-SELTZER) 325 MG TBEF   Oral   Take 325 mg by mouth every 6 (six) hours as needed (heart burn).         . fluticasone (FLONASE) 50 MCG/ACT nasal spray   Nasal   Place 2 sprays into the nose as needed for rhinitis or allergies.         Marland Kitchen lisinopril-hydrochlorothiazide (PRINZIDE,ZESTORETIC) 20-12.5 MG per tablet   Oral   Take 1 tablet by mouth daily.           Marland Kitchen lovastatin (MEVACOR) 20 MG tablet   Oral   Take 20 mg by mouth at bedtime.         . meloxicam (MOBIC) 15 MG tablet   Oral   Take 15 mg by mouth daily.         . traMADol (ULTRAM) 50 MG tablet   Oral   Take 50 mg by mouth 2 (two) times daily.         Marland Kitchen LORazepam (ATIVAN) 1 MG tablet    Oral   Take 1 tablet (1 mg total) by mouth 3 (three) times daily as needed for anxiety.   30 tablet   0     BP 163/71  Temp(Src) 97.7 F (36.5 C)  Resp 20  SpO2 100%  Physical Exam  Nursing note and vitals reviewed. Constitutional: She is oriented to person, place, and time. She appears well-developed and well-nourished.  HENT:  Head: Normocephalic and atraumatic.  Eyes: Conjunctivae and EOM are normal. Pupils are equal, round, and reactive to light.  Neck: Normal range of motion and phonation normal. Neck supple.  Cardiovascular: Normal rate, regular rhythm and intact distal pulses.   Pulmonary/Chest: Effort normal and breath sounds normal. She exhibits no tenderness.  Abdominal: Soft. She exhibits no distension. There is no tenderness. There is no guarding.  Musculoskeletal: Normal range of motion.  Neurological: She is alert and oriented to person, place, and time. She has normal strength. She exhibits normal muscle tone.  No dysarthria or aphasia. No focal asymmetry of strength.  Skin: Skin is warm and dry.  Psychiatric: She has a normal mood and affect. Her behavior is normal. Judgment and thought content normal.    ED Course  Procedures (including critical care time)   Reevaluation: 17:55- she is alert, calm, cooperative, no paresthesias. No headache, no dysarthria, no aphasia. ED evaluation is complete. She now states that she has had periods of slurred speech for 6 months, and has talked to her PCP, about it. These have occasionally occurred with chest pain. She has had that comprehensively evaluated with cardiac echo cardiac stress test and Holter monitoring. These tests have all been unrevealing. She has been on a number of medicines, primarily for pain, but has never tried Ativan. She denies significant stress now, but states that she did have a lot 2 years ago. Gait trial, done; she is able to ambulate without difficulty.   Date: 10/11/12  Rate: 63  Rhythm: normal  sinus rhythm  QRS Axis: normal  PR and QT Intervals: normal  ST/T Wave abnormalities: normal  PR and QRS Conduction Disutrbances:none  Narrative Interpretation:   Old EKG Reviewed: unchanged- 07/01/12   Labs Reviewed  COMPREHENSIVE METABOLIC PANEL - Abnormal; Notable for the following:    Total Bilirubin 0.2 (*)    GFR calc non Af Amer 64 (*)    GFR calc  Af Amer 74 (*)    All other components within normal limits  POCT I-STAT, CHEM 8 - Abnormal; Notable for the following:    Calcium, Ion 1.06 (*)    All other components within normal limits  ETHANOL  PROTIME-INR  APTT  CBC  DIFFERENTIAL  TROPONIN I  URINE RAPID DRUG SCREEN (HOSP PERFORMED)  URINALYSIS, ROUTINE W REFLEX MICROSCOPIC  GLUCOSE, CAPILLARY  POCT I-STAT TROPONIN I   Ct Head Wo Contrast  10/11/2012   *RADIOLOGY REPORT*  Clinical Data: Slurred speech, weakness and trouble ambulating.  CT HEAD WITHOUT CONTRAST  Technique:  Contiguous axial images were obtained from the base of the skull through the vertex without contrast.  Comparison: None.  Findings: There is subtle low density in the subcortical white matter, particularly in the left frontal lobe.  This could represent chronic changes.  Concern for a focal low density along the left side of the pons on image 8.  There is also concern for low density in the inferior left cerebellar hemisphere.  There is no evidence for acute hemorrhage.  No evidence for midline shift or hydrocephalus.  No acute bony abnormality.  There is mild mucosal disease paranasal sinuses.  IMPRESSION:  Nonspecific low density in the pons and left cerebellar hemisphere. Findings could represent infarcts or insults.  These findings could be better characterized with MRI.  No evidence for acute hemorrhage.  Subtle low density in the white matter may represent chronic white matter changes.  These results were called by telephone on 10/11/2012 at 3:34 p.m. to Dr. Effie Shy, who verbally acknowledged these results.    Original Report Authenticated By: Richarda Overlie, M.D.   Mr Angiogram Head Wo Contrast  10/11/2012   *RADIOLOGY REPORT*  Clinical Data:  Slurred speech.  Weakness.  Difficulty walking. Hypertension.  Abnormal head CT.  MRI HEAD WITHOUT CONTRAST MRA HEAD WITHOUT CONTRAST  Technique:  Multiplanar, multiecho pulse sequences of the brain and surrounding structures were obtained without intravenous contrast. Angiographic images of the head were obtained using MRA technique without contrast.  Comparison:  Head CT same day  MRI HEAD  Findings:  Diffusion imaging does not show any acute or subacute infarction.  The brainstem and cerebellum are normal.  The cerebral hemispheres show chronic appearing foci of T2 and FLAIR signal within the frontal white matter.  No cortical or large vessel territory insult.  No mass lesion, hemorrhage, hydrocephalus or extra-axial collection.  No pituitary mass.  Sinuses, middle ears and mastoids are clear.  No skull or skull base lesion.  IMPRESSION: No acute finding.  No cerebellar or brain stem abnormality.  Scattered foci of white matter signal in the frontal white matter. Often, this is not of any clinical relevance.  This could indicate an early manifestation of small vessel disease or could relate to previous trauma or migraine related foci.  MRA HEAD  Findings: Both internal carotid arteries are widely patent into the brain.  No siphon stenosis.  The anterior and middle cerebral vessels are patent without proximal stenosis, aneurysm or vascular malformation.  Both vertebral arteries are patent.  The right largely terminates in pica with a small contribution to the basilar.  No basilar stenosis.  Posterior circulation branch vessels appear unremarkable.  IMPRESSION: Normal intracranial MR angiography of the large and medium-sized vessels.   Original Report Authenticated By: Paulina Fusi, M.D.   Mr Brain Wo Contrast  10/11/2012   *RADIOLOGY REPORT*  Clinical Data:  Slurred speech.   Weakness.  Difficulty walking.  Hypertension.  Abnormal head CT.  MRI HEAD WITHOUT CONTRAST MRA HEAD WITHOUT CONTRAST  Technique:  Multiplanar, multiecho pulse sequences of the brain and surrounding structures were obtained without intravenous contrast. Angiographic images of the head were obtained using MRA technique without contrast.  Comparison:  Head CT same day  MRI HEAD  Findings:  Diffusion imaging does not show any acute or subacute infarction.  The brainstem and cerebellum are normal.  The cerebral hemispheres show chronic appearing foci of T2 and FLAIR signal within the frontal white matter.  No cortical or large vessel territory insult.  No mass lesion, hemorrhage, hydrocephalus or extra-axial collection.  No pituitary mass.  Sinuses, middle ears and mastoids are clear.  No skull or skull base lesion.  IMPRESSION: No acute finding.  No cerebellar or brain stem abnormality.  Scattered foci of white matter signal in the frontal white matter. Often, this is not of any clinical relevance.  This could indicate an early manifestation of small vessel disease or could relate to previous trauma or migraine related foci.  MRA HEAD  Findings: Both internal carotid arteries are widely patent into the brain.  No siphon stenosis.  The anterior and middle cerebral vessels are patent without proximal stenosis, aneurysm or vascular malformation.  Both vertebral arteries are patent.  The right largely terminates in pica with a small contribution to the basilar.  No basilar stenosis.  Posterior circulation branch vessels appear unremarkable.  IMPRESSION: Normal intracranial MR angiography of the large and medium-sized vessels.   Original Report Authenticated By: Paulina Fusi, M.D.     1. Slurred speech   2. Paresthesia       MDM  Nonspecific paresthesias, slurred speech, transient and resolved with negative evaluation for acute intracranial abnormalities. Doubt CVA, meningitis, occult infection, TIA, cardiac  arrhythmia or ACS. No syncope, associated with this process. Symptoms are apparently recurrent and chronic for at least 6 months. I suspect that she has any anxiety disorder.  Doubt metabolic instability, serious bacterial infection or impending vascular collapse; the patient is stable for discharge.  Nursing Notes Reviewed/ Care Coordinated, and agree without changes. Applicable Imaging Reviewed.  Interpretation of Laboratory Data incorporated into ED treatment   Plan: Home Medications- Ativan; Home Treatments- rest; Recommended follow up- PCP followup in one week, consider referral to neurology for more comprehensive evaluation with possible EEG testing for seizure disorder          Flint Melter, MD 10/11/12 1806

## 2012-10-20 NOTE — ED Provider Notes (Signed)
Medical screening examination/treatment/procedure(s) were performed by non-physician practitioner and as supervising physician I was immediately available for consultation/collaboration.   Gavin Pound. Brendalee Matthies, MD 10/20/12 1428

## 2013-07-05 ENCOUNTER — Ambulatory Visit: Payer: Medicare Other

## 2013-07-09 ENCOUNTER — Other Ambulatory Visit (HOSPITAL_COMMUNITY): Payer: Self-pay | Admitting: Internal Medicine

## 2013-07-09 DIAGNOSIS — Z1231 Encounter for screening mammogram for malignant neoplasm of breast: Secondary | ICD-10-CM

## 2013-07-12 ENCOUNTER — Ambulatory Visit: Payer: Medicare Other

## 2013-07-19 ENCOUNTER — Ambulatory Visit: Payer: Medicare Other

## 2013-08-20 ENCOUNTER — Ambulatory Visit (HOSPITAL_COMMUNITY): Payer: Medicaid Other

## 2013-08-30 ENCOUNTER — Ambulatory Visit (HOSPITAL_COMMUNITY)
Admission: RE | Admit: 2013-08-30 | Discharge: 2013-08-30 | Disposition: A | Discharge status: Home / Self Care | Payer: Medicare Other | Source: Ambulatory Visit | Attending: Internal Medicine | Admitting: Internal Medicine

## 2013-08-30 DIAGNOSIS — Z1231 Encounter for screening mammogram for malignant neoplasm of breast: Secondary | ICD-10-CM | POA: Insufficient documentation

## 2014-01-02 ENCOUNTER — Telehealth (INDEPENDENT_AMBULATORY_CARE_PROVIDER_SITE_OTHER): Payer: Self-pay

## 2014-01-02 ENCOUNTER — Encounter (INDEPENDENT_AMBULATORY_CARE_PROVIDER_SITE_OTHER): Payer: Self-pay | Admitting: General Surgery

## 2014-01-02 ENCOUNTER — Ambulatory Visit (INDEPENDENT_AMBULATORY_CARE_PROVIDER_SITE_OTHER): Payer: Medicare Other | Admitting: General Surgery

## 2014-01-02 VITALS — BP 128/76 | HR 74 | Temp 97.6°F | Ht 67.0 in | Wt 200.0 lb

## 2014-01-02 DIAGNOSIS — K6289 Other specified diseases of anus and rectum: Secondary | ICD-10-CM

## 2014-01-02 MED ORDER — PRAMOXINE HCL 1 % RE FOAM: 1.0000 "application " | Freq: Every day | RECTAL | Status: DC

## 2014-01-02 NOTE — Telephone Encounter (Signed)
Called Merla Riches back with below msg. The hydrocortisone dose come with applicator to insert in the rectum. Okayed the prescription.

## 2014-01-02 NOTE — Telephone Encounter (Signed)
Error - duplicate

## 2014-01-02 NOTE — Telephone Encounter (Signed)
Pt was seen today by Dr. Maisie Fus and a Rx for Proctofoam 1% was called to her pharmacy.  This is too expensive.  Other options?

## 2014-01-02 NOTE — Telephone Encounter (Signed)
Pharmacy called regarding proctofoam that was prescribed. They only have the one with hydrocortisone cream and not the pramoxine foam. Will the cream be okay for pt to use? Advised we would call her back this afternoon once we talk to Dr Maisie Fus.

## 2014-01-02 NOTE — Telephone Encounter (Signed)
Per Dr. Maisie Fus this medication is her best recommendation.  Pt should contact Dr. Elnoria Howard for suggestions. Pt understood.

## 2014-01-02 NOTE — Telephone Encounter (Signed)
It needs to be a foam that can be inserted to the rectum.  Cream would only work if there was a Printmakerlong applicator.

## 2014-01-02 NOTE — Progress Notes (Signed)
Chief Complaint  Patient presents with  . eval hems    HISTORY: Carly Paul is a 53 y.o. female who presents to the office with anal pain.  Other symptoms include swelling and burning.  This had been occurring for about a year.  She has been diagnosed with Crohn's disease since 2006.  she has tried steroids, biofeedback, AZT in the past with some success.  BM's and sitting makes the symptoms worse.   It is continuous in nature.  her bowel habits are irregular and her bowel movements are somewhat loose right now.  She does have bloody stools 4-5 times a week.  her fiber intake is dietary. She has tried fiber supplements without success.  her last colonoscopy was in 2014.  There was some rectal inflammation more c/w prolapse inflammation that IBD.  She denies prolapsing tissue now.     Past Medical History  Diagnosis Date  . Allergy     sulfa and eggs  . Mitral valve prolapse   . Aortic insufficiency   . Arthritis     osteoarthritis  . Osteoporosis   . Crohn's disease   . Herniated disc   . Hypertension   . Abnormal Pap smear of cervix 1995    had cryo done      Past Surgical History  Procedure Laterality Date  . Laparoscopic supracervical hysterectomy    . Vaginal hysterectomy    . Abscess drainage      GI  . Bladder suspension          Current Outpatient Prescriptions  Medication Sig Dispense Refill  . beclomethasone (QVAR) 40 MCG/ACT inhaler Inhale into the lungs 2 (two) times daily.      . fluticasone (FLONASE) 50 MCG/ACT nasal spray Place 2 sprays into the nose as needed for rhinitis or allergies.      . hydrochlorothiazide (MICROZIDE) 12.5 MG capsule Take 12.5 mg by mouth daily.      . meloxicam (MOBIC) 15 MG tablet Take 15 mg by mouth daily.       No current facility-administered medications for this visit.      Allergies  Allergen Reactions  . Sulfa Antibiotics Swelling  . Eggs Or Egg-Derived Products Rash      Family History  Problem Relation Age of Onset   . Scleroderma Maternal Grandmother   . Heart disease Paternal Grandfather     Unsure of details  . Diabetes Paternal Grandfather   . Kidney disease Paternal Grandmother   . Heart disease Maternal Grandfather     CHF    History   Social History  . Marital Status: Single    Spouse Name: N/A    Number of Children: N/A  . Years of Education: N/A   Social History Main Topics  . Smoking status: Former Smoker -- 1 years    Types: Cigarettes    Quit date: 07/12/2010  . Smokeless tobacco: Never Used  . Alcohol Use: No  . Drug Use: No  . Sexual Activity: Not Currently    Birth Control/ Protection: Surgical   Other Topics Concern  . None   Social History Narrative   Lives with housemate.       REVIEW OF SYSTEMS - PERTINENT POSITIVES ONLY: Review of Systems - General ROS: negative for - chills, fever or weight loss Hematological and Lymphatic ROS: negative for - bleeding problems, blood clots or bruising Respiratory ROS: no cough, shortness of breath, or wheezing Cardiovascular ROS: no chest pain or dyspnea on  exertion Gastrointestinal ROS: positive for - abdominal pain and blood in stools negative for - nausea/vomiting Genito-Urinary ROS: no dysuria, trouble voiding, or hematuria  EXAM: Filed Vitals:   01/02/14 1021  BP: 128/76  Pulse: 74  Temp: 97.6 F (36.4 C)    General appearance: alert and cooperative Resp: clear to auscultation bilaterally Cardio: regular rate and rhythm GI: soft, non-tender; bowel sounds normal; no masses,  no organomegaly  Procedure: Anoscopy Surgeon: Maisie Fushomas Diagnosis: anal pain  Assistant: Suzie PortelaMoffitt After the risks and benefits were explained, verbal consent was obtained for above procedure  Anesthesia: none Findings: mild rectal mucosal inflammation, no anoderm inflammation.  Minimal hemorrhoid disease.     ASSESSMENT AND PLAN: Carly Paul is a 53 y.o. female with anal pain.  On exam she has inflamed rectal mucosa consistent  with her diagnosis of IBD.  Her hemorrhoids do not look inflamed.  Recommend topical treatment of rectal inflammation.  There is no surgical indications for her, given her IBD.  Treatment for anal pain would mostly consist of treating her Crohn's.  Will try some proctofoam.    Vanita PandaAlicia C Zoriah Pulice, MD Colon and Rectal Surgery / General Surgery [facility]      Visit Diagnoses: No diagnosis found.  Primary Care Physician: Rich Numbersei A Bonsu, DO

## 2014-01-02 NOTE — Patient Instructions (Signed)
Try the proctofoam daily at night.  You may have to use it for several weeks to see a difference.

## 2014-04-01 ENCOUNTER — Encounter (INDEPENDENT_AMBULATORY_CARE_PROVIDER_SITE_OTHER): Payer: Self-pay | Admitting: General Surgery

## 2014-08-19 ENCOUNTER — Other Ambulatory Visit (HOSPITAL_COMMUNITY): Payer: Self-pay | Admitting: Internal Medicine

## 2014-08-19 DIAGNOSIS — Z1231 Encounter for screening mammogram for malignant neoplasm of breast: Secondary | ICD-10-CM

## 2014-09-06 ENCOUNTER — Ambulatory Visit (HOSPITAL_COMMUNITY)
Admission: RE | Admit: 2014-09-06 | Discharge: 2014-09-06 | Disposition: A | Discharge status: Home / Self Care | Payer: Medicare Other | Source: Ambulatory Visit | Attending: Internal Medicine | Admitting: Internal Medicine

## 2014-09-06 DIAGNOSIS — Z1231 Encounter for screening mammogram for malignant neoplasm of breast: Secondary | ICD-10-CM | POA: Diagnosis present

## 2015-08-19 ENCOUNTER — Other Ambulatory Visit: Payer: Self-pay | Admitting: Internal Medicine

## 2015-08-19 DIAGNOSIS — Z1231 Encounter for screening mammogram for malignant neoplasm of breast: Secondary | ICD-10-CM

## 2015-09-26 ENCOUNTER — Ambulatory Visit
Admission: RE | Admit: 2015-09-26 | Discharge: 2015-09-26 | Disposition: A | Discharge status: Home / Self Care | Payer: Medicare Other | Source: Ambulatory Visit | Attending: Internal Medicine | Admitting: Internal Medicine

## 2015-09-26 DIAGNOSIS — Z1231 Encounter for screening mammogram for malignant neoplasm of breast: Secondary | ICD-10-CM

## 2016-08-26 DIAGNOSIS — Z01419 Encounter for gynecological examination (general) (routine) without abnormal findings: Secondary | ICD-10-CM | POA: Diagnosis not present

## 2016-08-26 DIAGNOSIS — N952 Postmenopausal atrophic vaginitis: Secondary | ICD-10-CM | POA: Diagnosis not present

## 2016-09-09 DIAGNOSIS — I10 Essential (primary) hypertension: Secondary | ICD-10-CM | POA: Diagnosis not present

## 2016-09-09 DIAGNOSIS — Z0000 Encounter for general adult medical examination without abnormal findings: Secondary | ICD-10-CM | POA: Diagnosis not present

## 2016-09-09 DIAGNOSIS — E785 Hyperlipidemia, unspecified: Secondary | ICD-10-CM | POA: Diagnosis not present

## 2016-09-09 DIAGNOSIS — J45909 Unspecified asthma, uncomplicated: Secondary | ICD-10-CM | POA: Diagnosis not present

## 2016-09-09 DIAGNOSIS — E119 Type 2 diabetes mellitus without complications: Secondary | ICD-10-CM | POA: Diagnosis not present

## 2016-09-09 DIAGNOSIS — E559 Vitamin D deficiency, unspecified: Secondary | ICD-10-CM | POA: Diagnosis not present

## 2016-09-09 DIAGNOSIS — M158 Other polyosteoarthritis: Secondary | ICD-10-CM | POA: Diagnosis not present

## 2016-09-09 DIAGNOSIS — J302 Other seasonal allergic rhinitis: Secondary | ICD-10-CM | POA: Diagnosis not present

## 2016-09-09 DIAGNOSIS — R51 Headache: Secondary | ICD-10-CM | POA: Diagnosis not present

## 2016-09-09 DIAGNOSIS — K219 Gastro-esophageal reflux disease without esophagitis: Secondary | ICD-10-CM | POA: Diagnosis not present

## 2016-09-14 DIAGNOSIS — E785 Hyperlipidemia, unspecified: Secondary | ICD-10-CM | POA: Diagnosis not present

## 2016-09-14 DIAGNOSIS — Z113 Encounter for screening for infections with a predominantly sexual mode of transmission: Secondary | ICD-10-CM | POA: Diagnosis not present

## 2016-09-14 DIAGNOSIS — E119 Type 2 diabetes mellitus without complications: Secondary | ICD-10-CM | POA: Diagnosis not present

## 2016-09-14 DIAGNOSIS — E559 Vitamin D deficiency, unspecified: Secondary | ICD-10-CM | POA: Diagnosis not present

## 2016-09-14 DIAGNOSIS — M158 Other polyosteoarthritis: Secondary | ICD-10-CM | POA: Diagnosis not present

## 2016-09-14 DIAGNOSIS — Z0110 Encounter for examination of ears and hearing without abnormal findings: Secondary | ICD-10-CM | POA: Diagnosis not present

## 2016-09-14 DIAGNOSIS — J302 Other seasonal allergic rhinitis: Secondary | ICD-10-CM | POA: Diagnosis not present

## 2016-09-14 DIAGNOSIS — Z0000 Encounter for general adult medical examination without abnormal findings: Secondary | ICD-10-CM | POA: Diagnosis not present

## 2016-09-14 DIAGNOSIS — R51 Headache: Secondary | ICD-10-CM | POA: Diagnosis not present

## 2016-09-14 DIAGNOSIS — K219 Gastro-esophageal reflux disease without esophagitis: Secondary | ICD-10-CM | POA: Diagnosis not present

## 2016-09-14 DIAGNOSIS — J45909 Unspecified asthma, uncomplicated: Secondary | ICD-10-CM | POA: Diagnosis not present

## 2016-09-14 DIAGNOSIS — I10 Essential (primary) hypertension: Secondary | ICD-10-CM | POA: Diagnosis not present

## 2016-10-26 DIAGNOSIS — I10 Essential (primary) hypertension: Secondary | ICD-10-CM | POA: Diagnosis not present

## 2016-10-26 DIAGNOSIS — J45909 Unspecified asthma, uncomplicated: Secondary | ICD-10-CM | POA: Diagnosis not present

## 2016-10-26 DIAGNOSIS — K219 Gastro-esophageal reflux disease without esophagitis: Secondary | ICD-10-CM | POA: Diagnosis not present

## 2016-10-26 DIAGNOSIS — E119 Type 2 diabetes mellitus without complications: Secondary | ICD-10-CM | POA: Diagnosis not present

## 2016-10-26 DIAGNOSIS — R51 Headache: Secondary | ICD-10-CM | POA: Diagnosis not present

## 2016-10-26 DIAGNOSIS — J302 Other seasonal allergic rhinitis: Secondary | ICD-10-CM | POA: Diagnosis not present

## 2016-10-26 DIAGNOSIS — M158 Other polyosteoarthritis: Secondary | ICD-10-CM | POA: Diagnosis not present

## 2016-10-26 DIAGNOSIS — E785 Hyperlipidemia, unspecified: Secondary | ICD-10-CM | POA: Diagnosis not present

## 2016-10-26 DIAGNOSIS — E559 Vitamin D deficiency, unspecified: Secondary | ICD-10-CM | POA: Diagnosis not present

## 2017-01-24 DIAGNOSIS — K219 Gastro-esophageal reflux disease without esophagitis: Secondary | ICD-10-CM | POA: Diagnosis not present

## 2017-01-24 DIAGNOSIS — I10 Essential (primary) hypertension: Secondary | ICD-10-CM | POA: Diagnosis not present

## 2017-01-24 DIAGNOSIS — R51 Headache: Secondary | ICD-10-CM | POA: Diagnosis not present

## 2017-01-24 DIAGNOSIS — E785 Hyperlipidemia, unspecified: Secondary | ICD-10-CM | POA: Diagnosis not present

## 2017-01-24 DIAGNOSIS — J302 Other seasonal allergic rhinitis: Secondary | ICD-10-CM | POA: Diagnosis not present

## 2017-01-24 DIAGNOSIS — E119 Type 2 diabetes mellitus without complications: Secondary | ICD-10-CM | POA: Diagnosis not present

## 2017-01-24 DIAGNOSIS — E559 Vitamin D deficiency, unspecified: Secondary | ICD-10-CM | POA: Diagnosis not present

## 2017-01-24 DIAGNOSIS — J45909 Unspecified asthma, uncomplicated: Secondary | ICD-10-CM | POA: Diagnosis not present

## 2017-02-21 DIAGNOSIS — E119 Type 2 diabetes mellitus without complications: Secondary | ICD-10-CM | POA: Diagnosis not present

## 2017-02-21 DIAGNOSIS — K219 Gastro-esophageal reflux disease without esophagitis: Secondary | ICD-10-CM | POA: Diagnosis not present

## 2017-02-21 DIAGNOSIS — I10 Essential (primary) hypertension: Secondary | ICD-10-CM | POA: Diagnosis not present

## 2017-02-21 DIAGNOSIS — E559 Vitamin D deficiency, unspecified: Secondary | ICD-10-CM | POA: Diagnosis not present

## 2017-02-21 DIAGNOSIS — J302 Other seasonal allergic rhinitis: Secondary | ICD-10-CM | POA: Diagnosis not present

## 2017-02-21 DIAGNOSIS — J45909 Unspecified asthma, uncomplicated: Secondary | ICD-10-CM | POA: Diagnosis not present

## 2017-02-21 DIAGNOSIS — E785 Hyperlipidemia, unspecified: Secondary | ICD-10-CM | POA: Diagnosis not present

## 2017-02-21 DIAGNOSIS — R51 Headache: Secondary | ICD-10-CM | POA: Diagnosis not present

## 2017-02-28 DIAGNOSIS — K5090 Crohn's disease, unspecified, without complications: Secondary | ICD-10-CM | POA: Diagnosis not present

## 2017-02-28 DIAGNOSIS — K5900 Constipation, unspecified: Secondary | ICD-10-CM | POA: Diagnosis not present

## 2017-03-28 DIAGNOSIS — K219 Gastro-esophageal reflux disease without esophagitis: Secondary | ICD-10-CM | POA: Diagnosis not present

## 2017-03-28 DIAGNOSIS — I10 Essential (primary) hypertension: Secondary | ICD-10-CM | POA: Diagnosis not present

## 2017-03-28 DIAGNOSIS — Z23 Encounter for immunization: Secondary | ICD-10-CM | POA: Diagnosis not present

## 2017-03-28 DIAGNOSIS — J302 Other seasonal allergic rhinitis: Secondary | ICD-10-CM | POA: Diagnosis not present

## 2017-03-28 DIAGNOSIS — J45909 Unspecified asthma, uncomplicated: Secondary | ICD-10-CM | POA: Diagnosis not present

## 2017-03-28 DIAGNOSIS — E785 Hyperlipidemia, unspecified: Secondary | ICD-10-CM | POA: Diagnosis not present

## 2017-03-28 DIAGNOSIS — E559 Vitamin D deficiency, unspecified: Secondary | ICD-10-CM | POA: Diagnosis not present

## 2017-03-28 DIAGNOSIS — E119 Type 2 diabetes mellitus without complications: Secondary | ICD-10-CM | POA: Diagnosis not present

## 2017-03-28 DIAGNOSIS — R51 Headache: Secondary | ICD-10-CM | POA: Diagnosis not present

## 2017-05-16 DIAGNOSIS — K219 Gastro-esophageal reflux disease without esophagitis: Secondary | ICD-10-CM | POA: Diagnosis not present

## 2017-05-16 DIAGNOSIS — J45909 Unspecified asthma, uncomplicated: Secondary | ICD-10-CM | POA: Diagnosis not present

## 2017-05-16 DIAGNOSIS — E785 Hyperlipidemia, unspecified: Secondary | ICD-10-CM | POA: Diagnosis not present

## 2017-05-16 DIAGNOSIS — E559 Vitamin D deficiency, unspecified: Secondary | ICD-10-CM | POA: Diagnosis not present

## 2017-05-16 DIAGNOSIS — J302 Other seasonal allergic rhinitis: Secondary | ICD-10-CM | POA: Diagnosis not present

## 2017-05-16 DIAGNOSIS — I10 Essential (primary) hypertension: Secondary | ICD-10-CM | POA: Diagnosis not present

## 2017-05-16 DIAGNOSIS — E119 Type 2 diabetes mellitus without complications: Secondary | ICD-10-CM | POA: Diagnosis not present

## 2017-05-16 DIAGNOSIS — R51 Headache: Secondary | ICD-10-CM | POA: Diagnosis not present

## 2017-05-19 DIAGNOSIS — E785 Hyperlipidemia, unspecified: Secondary | ICD-10-CM | POA: Diagnosis not present

## 2017-05-19 DIAGNOSIS — E559 Vitamin D deficiency, unspecified: Secondary | ICD-10-CM | POA: Diagnosis not present

## 2017-05-19 DIAGNOSIS — K219 Gastro-esophageal reflux disease without esophagitis: Secondary | ICD-10-CM | POA: Diagnosis not present

## 2017-05-19 DIAGNOSIS — R51 Headache: Secondary | ICD-10-CM | POA: Diagnosis not present

## 2017-05-19 DIAGNOSIS — I10 Essential (primary) hypertension: Secondary | ICD-10-CM | POA: Diagnosis not present

## 2017-05-19 DIAGNOSIS — J302 Other seasonal allergic rhinitis: Secondary | ICD-10-CM | POA: Diagnosis not present

## 2017-05-19 DIAGNOSIS — E119 Type 2 diabetes mellitus without complications: Secondary | ICD-10-CM | POA: Diagnosis not present

## 2017-05-19 DIAGNOSIS — J45909 Unspecified asthma, uncomplicated: Secondary | ICD-10-CM | POA: Diagnosis not present

## 2017-06-30 ENCOUNTER — Other Ambulatory Visit: Payer: Self-pay | Admitting: Internal Medicine

## 2017-06-30 DIAGNOSIS — Z1231 Encounter for screening mammogram for malignant neoplasm of breast: Secondary | ICD-10-CM

## 2017-07-22 ENCOUNTER — Ambulatory Visit
Admission: RE | Admit: 2017-07-22 | Discharge: 2017-07-22 | Disposition: A | Discharge status: Home / Self Care | Payer: Medicare Other | Source: Ambulatory Visit | Attending: Internal Medicine | Admitting: Internal Medicine

## 2017-07-22 DIAGNOSIS — Z1231 Encounter for screening mammogram for malignant neoplasm of breast: Secondary | ICD-10-CM | POA: Diagnosis not present

## 2017-09-12 DIAGNOSIS — Z5181 Encounter for therapeutic drug level monitoring: Secondary | ICD-10-CM | POA: Diagnosis not present

## 2017-09-12 DIAGNOSIS — K649 Unspecified hemorrhoids: Secondary | ICD-10-CM | POA: Diagnosis not present

## 2017-09-12 DIAGNOSIS — E785 Hyperlipidemia, unspecified: Secondary | ICD-10-CM | POA: Diagnosis not present

## 2017-09-12 DIAGNOSIS — E559 Vitamin D deficiency, unspecified: Secondary | ICD-10-CM | POA: Diagnosis not present

## 2017-09-12 DIAGNOSIS — J45909 Unspecified asthma, uncomplicated: Secondary | ICD-10-CM | POA: Diagnosis not present

## 2017-09-12 DIAGNOSIS — Z01118 Encounter for examination of ears and hearing with other abnormal findings: Secondary | ICD-10-CM | POA: Diagnosis not present

## 2017-09-12 DIAGNOSIS — R51 Headache: Secondary | ICD-10-CM | POA: Diagnosis not present

## 2017-09-12 DIAGNOSIS — J302 Other seasonal allergic rhinitis: Secondary | ICD-10-CM | POA: Diagnosis not present

## 2017-09-12 DIAGNOSIS — I10 Essential (primary) hypertension: Secondary | ICD-10-CM | POA: Diagnosis not present

## 2017-09-12 DIAGNOSIS — J4531 Mild persistent asthma with (acute) exacerbation: Secondary | ICD-10-CM | POA: Diagnosis not present

## 2017-09-12 DIAGNOSIS — Z136 Encounter for screening for cardiovascular disorders: Secondary | ICD-10-CM | POA: Diagnosis not present

## 2017-09-12 DIAGNOSIS — E119 Type 2 diabetes mellitus without complications: Secondary | ICD-10-CM | POA: Diagnosis not present

## 2017-09-12 DIAGNOSIS — K219 Gastro-esophageal reflux disease without esophagitis: Secondary | ICD-10-CM | POA: Diagnosis not present

## 2017-10-10 DIAGNOSIS — Z0000 Encounter for general adult medical examination without abnormal findings: Secondary | ICD-10-CM | POA: Diagnosis not present

## 2017-10-10 DIAGNOSIS — I10 Essential (primary) hypertension: Secondary | ICD-10-CM | POA: Diagnosis not present

## 2017-10-10 DIAGNOSIS — J302 Other seasonal allergic rhinitis: Secondary | ICD-10-CM | POA: Diagnosis not present

## 2017-10-10 DIAGNOSIS — K219 Gastro-esophageal reflux disease without esophagitis: Secondary | ICD-10-CM | POA: Diagnosis not present

## 2017-10-10 DIAGNOSIS — E559 Vitamin D deficiency, unspecified: Secondary | ICD-10-CM | POA: Diagnosis not present

## 2017-10-10 DIAGNOSIS — E119 Type 2 diabetes mellitus without complications: Secondary | ICD-10-CM | POA: Diagnosis not present

## 2017-10-10 DIAGNOSIS — E785 Hyperlipidemia, unspecified: Secondary | ICD-10-CM | POA: Diagnosis not present

## 2017-10-10 DIAGNOSIS — R51 Headache: Secondary | ICD-10-CM | POA: Diagnosis not present

## 2017-10-10 DIAGNOSIS — M158 Other polyosteoarthritis: Secondary | ICD-10-CM | POA: Diagnosis not present

## 2017-10-10 DIAGNOSIS — J45909 Unspecified asthma, uncomplicated: Secondary | ICD-10-CM | POA: Diagnosis not present

## 2017-12-26 DIAGNOSIS — H539 Unspecified visual disturbance: Secondary | ICD-10-CM | POA: Diagnosis not present

## 2017-12-26 DIAGNOSIS — E0865 Diabetes mellitus due to underlying condition with hyperglycemia: Secondary | ICD-10-CM | POA: Diagnosis not present

## 2017-12-26 DIAGNOSIS — H538 Other visual disturbances: Secondary | ICD-10-CM | POA: Diagnosis not present

## 2018-01-09 DIAGNOSIS — E119 Type 2 diabetes mellitus without complications: Secondary | ICD-10-CM | POA: Diagnosis not present

## 2018-01-09 DIAGNOSIS — E785 Hyperlipidemia, unspecified: Secondary | ICD-10-CM | POA: Diagnosis not present

## 2018-01-09 DIAGNOSIS — K219 Gastro-esophageal reflux disease without esophagitis: Secondary | ICD-10-CM | POA: Diagnosis not present

## 2018-01-09 DIAGNOSIS — J302 Other seasonal allergic rhinitis: Secondary | ICD-10-CM | POA: Diagnosis not present

## 2018-01-09 DIAGNOSIS — I10 Essential (primary) hypertension: Secondary | ICD-10-CM | POA: Diagnosis not present

## 2018-01-09 DIAGNOSIS — R51 Headache: Secondary | ICD-10-CM | POA: Diagnosis not present

## 2018-01-09 DIAGNOSIS — M158 Other polyosteoarthritis: Secondary | ICD-10-CM | POA: Diagnosis not present

## 2018-01-09 DIAGNOSIS — J45909 Unspecified asthma, uncomplicated: Secondary | ICD-10-CM | POA: Diagnosis not present

## 2018-01-09 DIAGNOSIS — E559 Vitamin D deficiency, unspecified: Secondary | ICD-10-CM | POA: Diagnosis not present

## 2018-02-06 DIAGNOSIS — I10 Essential (primary) hypertension: Secondary | ICD-10-CM | POA: Diagnosis not present

## 2018-02-06 DIAGNOSIS — J302 Other seasonal allergic rhinitis: Secondary | ICD-10-CM | POA: Diagnosis not present

## 2018-02-06 DIAGNOSIS — E785 Hyperlipidemia, unspecified: Secondary | ICD-10-CM | POA: Diagnosis not present

## 2018-02-06 DIAGNOSIS — M158 Other polyosteoarthritis: Secondary | ICD-10-CM | POA: Diagnosis not present

## 2018-02-06 DIAGNOSIS — J45909 Unspecified asthma, uncomplicated: Secondary | ICD-10-CM | POA: Diagnosis not present

## 2018-02-06 DIAGNOSIS — E559 Vitamin D deficiency, unspecified: Secondary | ICD-10-CM | POA: Diagnosis not present

## 2018-02-06 DIAGNOSIS — E119 Type 2 diabetes mellitus without complications: Secondary | ICD-10-CM | POA: Diagnosis not present

## 2018-02-23 ENCOUNTER — Encounter (HOSPITAL_COMMUNITY): Payer: Self-pay | Admitting: *Deleted

## 2018-02-23 ENCOUNTER — Other Ambulatory Visit: Payer: Self-pay

## 2018-02-23 DIAGNOSIS — T887XXA Unspecified adverse effect of drug or medicament, initial encounter: Secondary | ICD-10-CM | POA: Insufficient documentation

## 2018-02-23 DIAGNOSIS — T485X5A Adverse effect of other anti-common-cold drugs, initial encounter: Secondary | ICD-10-CM | POA: Diagnosis not present

## 2018-02-23 DIAGNOSIS — R0989 Other specified symptoms and signs involving the circulatory and respiratory systems: Secondary | ICD-10-CM | POA: Diagnosis not present

## 2018-02-23 DIAGNOSIS — Y658 Other specified misadventures during surgical and medical care: Secondary | ICD-10-CM | POA: Insufficient documentation

## 2018-02-23 DIAGNOSIS — Z87891 Personal history of nicotine dependence: Secondary | ICD-10-CM | POA: Diagnosis not present

## 2018-02-23 DIAGNOSIS — T50905A Adverse effect of unspecified drugs, medicaments and biological substances, initial encounter: Secondary | ICD-10-CM | POA: Diagnosis not present

## 2018-02-23 DIAGNOSIS — I10 Essential (primary) hypertension: Secondary | ICD-10-CM | POA: Insufficient documentation

## 2018-02-23 DIAGNOSIS — Z79899 Other long term (current) drug therapy: Secondary | ICD-10-CM | POA: Insufficient documentation

## 2018-02-23 DIAGNOSIS — R002 Palpitations: Secondary | ICD-10-CM | POA: Diagnosis not present

## 2018-02-23 LAB — CBC
HCT: 38.1 % (ref 36.0–46.0)
Hemoglobin: 12.5 g/dL (ref 12.0–15.0)
MCH: 27.8 pg (ref 26.0–34.0)
MCHC: 32.8 g/dL (ref 30.0–36.0)
MCV: 84.9 fL (ref 78.0–100.0)
Platelets: 248 10*3/uL (ref 150–400)
RBC: 4.49 MIL/uL (ref 3.87–5.11)
RDW: 15.2 % (ref 11.5–15.5)
WBC: 6.6 10*3/uL (ref 4.0–10.5)

## 2018-02-23 LAB — BASIC METABOLIC PANEL
Anion gap: 9 (ref 5–15)
BUN: 19 mg/dL (ref 6–20)
CO2: 28 mmol/L (ref 22–32)
Calcium: 9.2 mg/dL (ref 8.9–10.3)
Chloride: 105 mmol/L (ref 98–111)
Creatinine, Ser: 0.84 mg/dL (ref 0.44–1.00)
GFR calc Af Amer: >60 mL/min (ref >60)
GFR calc non Af Amer: >60 mL/min (ref >60)
Glucose, Bld: 136 mg/dL — ABNORMAL HIGH (ref 70–99)
Potassium: 3.4 mmol/L — ABNORMAL LOW (ref 3.5–5.1)
Sodium: 142 mmol/L (ref 135–145)

## 2018-02-23 LAB — I-STAT TROPONIN, ED: Troponin i, poc: 0 ng/mL (ref 0.00–0.08)

## 2018-02-23 NOTE — ED Triage Notes (Signed)
Pt arrives with c/o palpitations. Says that she was asleep and her heart was beating fast. She stood up, became dizzy and felt like she was going to pass out. Palpitations lasted about 15 minutes. She did take some cold medicine before she went to bed for a cough.

## 2018-02-24 ENCOUNTER — Emergency Department (HOSPITAL_COMMUNITY)
Admission: EM | Admit: 2018-02-24 | Discharge: 2018-02-24 | Disposition: A | Discharge status: Home / Self Care | Payer: Medicare Other | Attending: Emergency Medicine | Admitting: Emergency Medicine

## 2018-02-24 ENCOUNTER — Emergency Department (HOSPITAL_COMMUNITY): Payer: Medicare Other

## 2018-02-24 ENCOUNTER — Encounter (HOSPITAL_COMMUNITY): Payer: Self-pay | Admitting: Emergency Medicine

## 2018-02-24 DIAGNOSIS — R0989 Other specified symptoms and signs involving the circulatory and respiratory systems: Secondary | ICD-10-CM | POA: Diagnosis not present

## 2018-02-24 DIAGNOSIS — T50905A Adverse effect of unspecified drugs, medicaments and biological substances, initial encounter: Secondary | ICD-10-CM

## 2018-02-24 LAB — I-STAT TROPONIN, ED: Troponin i, poc: 0 ng/mL (ref 0.00–0.08)

## 2018-02-24 MED ORDER — LORATADINE 10 MG PO TABS
10.0000 mg | ORAL_TABLET | Freq: Once | ORAL | Status: AC
  Administered 2018-02-24: 10 mg via ORAL
  Filled 2018-02-24: qty 1

## 2018-02-24 NOTE — ED Provider Notes (Signed)
[facility]-EMERGENCY DEPT Provider Note   CSN: [id number] Arrival date & time: 02/23/18  2236     History   Chief Complaint Chief Complaint  Patient presents with  . Palpitations    HPI Carly Paul is a 57 y.o. female.  The history is provided by the patient.  Palpitations   This is a new problem. The current episode started 6 to 12 hours ago. The problem occurs constantly. The problem has not changed since onset.Associated with: cold preparations. Pertinent negatives include no diaphoresis, no fever, no malaise/fatigue, no numbness, no chest pain, no chest pressure, no claudication, no exertional chest pressure, no irregular heartbeat, no PND, no syncope, no abdominal pain, no nausea, no vomiting, no headaches, no back pain and no shortness of breath. She has tried nothing for the symptoms. The treatment provided no relief. There are no known risk factors. Her past medical history does not include hyperthyroidism.  No long car trips or plane trips.  No DOE, no SOB, no Exertional CP.  Took more than one over the counter cold preparation and had palpitations.    Past Medical History:  Diagnosis Date  . Abnormal Pap smear of cervix 1995   had cryo done  . Allergy    sulfa and eggs  . Aortic insufficiency   . Arthritis    osteoarthritis  . Crohn's disease (HCC)   . Herniated disc   . Hypertension   . Mitral valve prolapse   . Osteoporosis     Patient Active Problem List   Diagnosis Date Noted  . Crohn's disease (HCC) 03/12/2011  . Bartholin's gland abscess 03/12/2011    Past Surgical History:  Procedure Laterality Date  . ABSCESS DRAINAGE     GI  . BLADDER SUSPENSION    . LAPAROSCOPIC SUPRACERVICAL HYSTERECTOMY    . VAGINAL HYSTERECTOMY       OB History    Gravida  3   Para  2   Term  2   Preterm      AB  1   Living  2     SAB  1   TAB      Ectopic      Multiple      Live Births               Home Medications     Prior to Admission medications   Medication Sig Start Date End Date Taking? Authorizing Provider  hydrochlorothiazide (MICROZIDE) 12.5 MG capsule Take 12.5 mg by mouth daily.   Yes [provider]  linaclotide (LINZESS) 290 MCG CAPS capsule Take 290 mcg by mouth daily before breakfast.   Yes [provider]  pramoxine (PROCTOFOAM) 1 % foam Place 1 application rectally at bedtime. Patient not taking: Reported on 02/24/2018 01/02/14   Romie Levee, MD    Family History Family History  Problem Relation Age of Onset  . Scleroderma Maternal Grandmother   . Heart disease Paternal Grandfather        Unsure of details  . Diabetes Paternal Grandfather   . Kidney disease Paternal Grandmother   . Heart disease Maternal Grandfather        CHF    Social History Social History   Tobacco Use  . Smoking status: Former Smoker    Years: 1.00    Types: Cigarettes    Last attempt to quit: 07/12/2010    Years since quitting: 7.6  . Smokeless tobacco: Never Used  Substance Use Topics  .  Alcohol use: No  . Drug use: No     Allergies   Sulfa antibiotics and Eggs or egg-derived products   Review of Systems Review of Systems  Constitutional: Negative for diaphoresis, fever and malaise/fatigue.  Respiratory: Negative for chest tightness and shortness of breath.   Cardiovascular: Positive for palpitations. Negative for chest pain, claudication, leg swelling, syncope and PND.  Gastrointestinal: Negative for abdominal pain, nausea and vomiting.  Musculoskeletal: Negative for back pain.  Neurological: Negative for numbness and headaches.  All other systems reviewed and are negative.    Physical Exam Updated Vital Signs BP 130/60 (BP Location: Right Arm)   Pulse 69   Temp 98.1 F (36.7 C) (Oral)   Resp 15   Ht 5' 7.5" (1.715 m)   Wt 81.6 kg   SpO2 99%   BMI 27.78 kg/m   Physical Exam  Constitutional: She is oriented to person, place, and time. She appears  well-developed and well-nourished. No distress.  HENT:  Head: Normocephalic and atraumatic.  Right Ear: External ear normal.  Left Ear: External ear normal.  Nose: Nose normal.  Mouth/Throat: Oropharynx is clear and moist. No oropharyngeal exudate.  Eyes: Pupils are equal, round, and reactive to light. Conjunctivae are normal.  Neck: Normal range of motion. Neck supple.  Cardiovascular: Normal rate, regular rhythm, normal heart sounds and intact distal pulses.  Pulmonary/Chest: Effort normal and breath sounds normal. No stridor. She has no wheezes. She has no rales.  Abdominal: Soft. Bowel sounds are normal. She exhibits no mass. There is no tenderness. There is no rebound and no guarding.  Musculoskeletal: Normal range of motion.  Neurological: She is alert and oriented to person, place, and time. She displays normal reflexes.  Skin: Skin is warm and dry. Capillary refill takes less than 2 seconds.  Psychiatric: She has a normal mood and affect.     ED Treatments / Results  Labs (all labs ordered are listed, but only abnormal results are displayed) Results for orders placed or performed during the hospital encounter of 02/24/18  Basic metabolic panel  Result Value Ref Range   Sodium 142 135 - 145 mmol/L   Potassium 3.4 (L) 3.5 - 5.1 mmol/L   Chloride 105 98 - 111 mmol/L   CO2 28 22 - 32 mmol/L   Glucose, Bld 136 (H) 70 - 99 mg/dL   BUN 19 6 - 20 mg/dL   Creatinine, Ser 3.66 0.44 - 1.00 mg/dL   Calcium 9.2 8.9 - 29.4 mg/dL   GFR calc non Af Amer >60 >60 mL/min   GFR calc Af Amer >60 >60 mL/min   Anion gap 9 5 - 15  CBC  Result Value Ref Range   WBC 6.6 4.0 - 10.5 K/uL   RBC 4.49 3.87 - 5.11 MIL/uL   Hemoglobin 12.5 12.0 - 15.0 g/dL   HCT 76.5 46.5 - 03.5 %   MCV 84.9 78.0 - 100.0 fL   MCH 27.8 26.0 - 34.0 pg   MCHC 32.8 30.0 - 36.0 g/dL   RDW 46.5 68.1 - 27.5 %   Platelets 248 150 - 400 K/uL  I-stat troponin, ED  Result Value Ref Range   Troponin i, poc 0.00 0.00 -  0.08 ng/mL   Comment 3          I-stat troponin, ED  Result Value Ref Range   Troponin i, poc 0.00 0.00 - 0.08 ng/mL   Comment 3  Dg Chest 2 View  Result Date: 02/24/2018 CLINICAL DATA:  57 year old female with congestion. EXAM: CHEST - 2 VIEW COMPARISON:  Chest radiograph dated 10/09/2012 FINDINGS: The heart size and mediastinal contours are within normal limits. Both lungs are clear. The visualized skeletal structures are unremarkable. IMPRESSION: No active cardiopulmonary disease. Electronically Signed   By: Elgie Collard M.D.   On: 02/24/2018 05:25    EKG EKG Interpretation  Date/Time:  Thursday February 23 2018 22:44:16 EDT Ventricular Rate:  63 PR Interval:    QRS Duration: 89 QT Interval:  402 QTC Calculation: 412 R Axis:   53 Text Interpretation:  Sinus rhythm Borderline short PR interval Confirmed by Tula Schryver ([id number]) on 02/24/2018 2:55:42 AM   Radiology Dg Chest 2 View  Result Date: 02/24/2018 CLINICAL DATA:  57 year old female with congestion. EXAM: CHEST - 2 VIEW COMPARISON:  Chest radiograph dated 10/09/2012 FINDINGS: The heart size and mediastinal contours are within normal limits. Both lungs are clear. The visualized skeletal structures are unremarkable. IMPRESSION: No active cardiopulmonary disease. Electronically Signed   By: Elgie Collard M.D.   On: 02/24/2018 05:25    Procedures Procedures (including critical care time)  Medications Ordered in ED Medications  loratadine (CLARITIN) tablet 10 mg (10 mg Oral Given 02/24/18 0441)      Final Clinical Impressions(s) / ED Diagnoses   Return for weakness, numbness, changes in vision or speech, fevers >100.4 unrelieved by medication, shortness of breath, intractable vomiting, or diarrhea, abdominal pain, Inability to tolerate liquids or food, cough, altered mental status or any concerns. No signs of systemic illness or infection. The patient is nontoxic-appearing on exam and vital signs are  within normal limits.    I have reviewed the triage vital signs and the nursing notes. Pertinent labs &imaging results that were available during my care of the patient were reviewed by me and considered in my medical decision making (see chart for details).  After history, exam, and medical workup I feel the patient has been appropriately medically screened and is safe for discharge home. Pertinent diagnoses were discussed with the patient. Patient was given return precautions.       Leshay Desaulniers, MD 02/24/18 [phone]

## 2018-03-29 DIAGNOSIS — R079 Chest pain, unspecified: Secondary | ICD-10-CM | POA: Diagnosis not present

## 2018-03-29 DIAGNOSIS — R0602 Shortness of breath: Secondary | ICD-10-CM | POA: Diagnosis not present

## 2018-03-29 DIAGNOSIS — K5090 Crohn's disease, unspecified, without complications: Secondary | ICD-10-CM | POA: Diagnosis not present

## 2018-03-29 DIAGNOSIS — K5900 Constipation, unspecified: Secondary | ICD-10-CM | POA: Diagnosis not present

## 2018-04-12 DIAGNOSIS — R0789 Other chest pain: Secondary | ICD-10-CM | POA: Diagnosis not present

## 2018-04-12 DIAGNOSIS — I10 Essential (primary) hypertension: Secondary | ICD-10-CM | POA: Diagnosis not present

## 2018-04-12 DIAGNOSIS — R0602 Shortness of breath: Secondary | ICD-10-CM | POA: Diagnosis not present

## 2018-04-12 DIAGNOSIS — R002 Palpitations: Secondary | ICD-10-CM | POA: Diagnosis not present

## 2018-04-14 DIAGNOSIS — R002 Palpitations: Secondary | ICD-10-CM | POA: Diagnosis not present

## 2018-05-02 DIAGNOSIS — I10 Essential (primary) hypertension: Secondary | ICD-10-CM | POA: Diagnosis not present

## 2018-05-02 DIAGNOSIS — J45909 Unspecified asthma, uncomplicated: Secondary | ICD-10-CM | POA: Diagnosis not present

## 2018-05-02 DIAGNOSIS — E7800 Pure hypercholesterolemia, unspecified: Secondary | ICD-10-CM | POA: Diagnosis not present

## 2018-05-02 DIAGNOSIS — J302 Other seasonal allergic rhinitis: Secondary | ICD-10-CM | POA: Diagnosis not present

## 2018-05-02 DIAGNOSIS — E559 Vitamin D deficiency, unspecified: Secondary | ICD-10-CM | POA: Diagnosis not present

## 2018-05-02 DIAGNOSIS — M158 Other polyosteoarthritis: Secondary | ICD-10-CM | POA: Diagnosis not present

## 2018-05-02 DIAGNOSIS — E785 Hyperlipidemia, unspecified: Secondary | ICD-10-CM | POA: Diagnosis not present

## 2018-05-02 DIAGNOSIS — E119 Type 2 diabetes mellitus without complications: Secondary | ICD-10-CM | POA: Diagnosis not present

## 2018-05-04 DIAGNOSIS — R0602 Shortness of breath: Secondary | ICD-10-CM | POA: Diagnosis not present

## 2018-05-04 DIAGNOSIS — R079 Chest pain, unspecified: Secondary | ICD-10-CM | POA: Diagnosis not present

## 2018-05-04 DIAGNOSIS — I10 Essential (primary) hypertension: Secondary | ICD-10-CM | POA: Diagnosis not present

## 2018-05-05 DIAGNOSIS — R0789 Other chest pain: Secondary | ICD-10-CM | POA: Diagnosis not present

## 2018-05-05 DIAGNOSIS — R0602 Shortness of breath: Secondary | ICD-10-CM | POA: Diagnosis not present

## 2018-05-05 DIAGNOSIS — I10 Essential (primary) hypertension: Secondary | ICD-10-CM | POA: Diagnosis not present

## 2018-05-08 DIAGNOSIS — R9431 Abnormal electrocardiogram [ECG] [EKG]: Secondary | ICD-10-CM | POA: Diagnosis not present

## 2018-05-08 DIAGNOSIS — R0789 Other chest pain: Secondary | ICD-10-CM | POA: Diagnosis not present

## 2018-05-19 DIAGNOSIS — E785 Hyperlipidemia, unspecified: Secondary | ICD-10-CM | POA: Diagnosis not present

## 2018-05-19 DIAGNOSIS — R0789 Other chest pain: Secondary | ICD-10-CM | POA: Diagnosis not present

## 2018-05-19 DIAGNOSIS — R0602 Shortness of breath: Secondary | ICD-10-CM | POA: Diagnosis not present

## 2018-05-19 DIAGNOSIS — I10 Essential (primary) hypertension: Secondary | ICD-10-CM | POA: Diagnosis not present

## 2018-06-12 ENCOUNTER — Other Ambulatory Visit: Payer: Self-pay | Admitting: Internal Medicine

## 2018-06-12 DIAGNOSIS — Z1231 Encounter for screening mammogram for malignant neoplasm of breast: Secondary | ICD-10-CM

## 2018-06-19 DIAGNOSIS — I10 Essential (primary) hypertension: Secondary | ICD-10-CM | POA: Diagnosis not present

## 2018-06-19 DIAGNOSIS — E7800 Pure hypercholesterolemia, unspecified: Secondary | ICD-10-CM | POA: Diagnosis not present

## 2018-06-19 DIAGNOSIS — E119 Type 2 diabetes mellitus without complications: Secondary | ICD-10-CM | POA: Diagnosis not present

## 2018-06-19 DIAGNOSIS — M158 Other polyosteoarthritis: Secondary | ICD-10-CM | POA: Diagnosis not present

## 2018-06-19 DIAGNOSIS — J45909 Unspecified asthma, uncomplicated: Secondary | ICD-10-CM | POA: Diagnosis not present

## 2018-06-26 DIAGNOSIS — I10 Essential (primary) hypertension: Secondary | ICD-10-CM | POA: Diagnosis not present

## 2018-06-26 DIAGNOSIS — E785 Hyperlipidemia, unspecified: Secondary | ICD-10-CM | POA: Diagnosis not present

## 2018-07-15 ENCOUNTER — Encounter: Payer: Self-pay | Admitting: Cardiology

## 2018-07-15 DIAGNOSIS — I359 Nonrheumatic aortic valve disorder, unspecified: Secondary | ICD-10-CM

## 2018-07-15 DIAGNOSIS — R002 Palpitations: Secondary | ICD-10-CM

## 2018-07-15 HISTORY — DX: Nonrheumatic aortic valve disorder, unspecified: I35.9

## 2018-07-15 HISTORY — DX: Palpitations: R00.2

## 2018-07-15 NOTE — Progress Notes (Signed)
  Subjective:  Primary Physician:  Bonsu, Osei A, DO  Patient ID: Carly Paul, female    DOB: 11/13/1960, 57 y.o.   MRN: [id number]  Chief Complaint  Patient presents with  . Hyperlipidemia    2 month F/U  . Chest Pain    HPI: Carly Paul  is a 57 y.o. female  with  h/o iron deficiency anemia, hyperlipidemia, Crohn's disease diagnosed in 2006, hypertension, history of for  MRI negative TIAs from 2009 until 2014.  She complains of palpitations ongoing for several years, described as mostly occurring at rest, sometimes feels like it is rapidly racing and sometimes feels like skipping beats.. She is also noticed gradual worsening dyspnea. No PND or orthopnea or leg edema. She also complains left-sided chest discomfort. Chest discomfort is tightness, heaviness in the left upper part of the chest. Symptoms are vague. She also has occasional palpitations.   She has had sudden onset of weakness in the right leg and was diagnosed with having stroke at [facility], also in [place]. However, MRI and MRA of the brain performed 10/11/2012 was completely normal except for scattered foci of white matter signal in the frontal lobe suggestive of early manifestation of small vessel disease or could be related to previous trauma or migraine.   This is her 2 month visit to evaluate chest pain. Patient was seen by me and on last office visit,I started her on atenolol for hypertension and also palpitations, atorvastatin.  She has noticed improvement in palpitations although palpitation symptoms have now recurred.  She is tolerating atorvastatin without side effects.  She still continues to have occasional chest discomfort not necessarily exertional but can come at any time.  She is wondering if she can start exercise.  Past Medical History:  Diagnosis Date  . Abnormal Pap smear of cervix 1995   had cryo done  . Allergy    sulfa and eggs  . Aortic insufficiency   . Aortic valve disorder 07/15/2018  .  Arthritis    osteoarthritis  . Crohn's disease (HCC)   . Herniated disc   . Hypertension   . Mitral valve prolapse   . Osteoporosis   . Palpitations 07/15/2018    Past Surgical History:  Procedure Laterality Date  . ABSCESS DRAINAGE     GI  . BLADDER SUSPENSION    . LAPAROSCOPIC SUPRACERVICAL HYSTERECTOMY    . VAGINAL HYSTERECTOMY      Social History   Socioeconomic History  . Marital status: Single    Spouse name: Not on file  . Number of children: 2  . Years of education: Not on file  . Highest education level: Not on file  Occupational History  . Not on file  Social Needs  . Financial resource strain: Not on file  . Food insecurity:    Worry: Not on file    Inability: Not on file  . Transportation needs:    Medical: Not on file    Non-medical: Not on file  Tobacco Use  . Smoking status: Former Smoker    Packs/day: 0.25    Years: 1.00    Pack years: 0.25    Types: Cigarettes    Last attempt to quit: 07/12/2010    Years since quitting: 8.0  . Smokeless tobacco: Never Used  . Tobacco comment: Pt only smoked 1-2 cigarettes per day  Substance and Sexual Activity  . Alcohol use: No  . Drug use: No  . Sexual activity: Not Currently      Birth control/protection: Surgical  Lifestyle  . Physical activity:    Days per week: Not on file    Minutes per session: Not on file  . Stress: Not on file  Relationships  . Social connections:    Talks on phone: Not on file    Gets together: Not on file    Attends religious service: Not on file    Active member of club or organization: Not on file    Attends meetings of clubs or organizations: Not on file    Relationship status: Not on file  . Intimate partner violence:    Fear of current or ex partner: Not on file    Emotionally abused: Not on file    Physically abused: Not on file    Forced sexual activity: Not on file  Other Topics Concern  . Not on file  Social History Narrative   Lives with housemate.      Current Outpatient Medications on File Prior to Visit  Medication Sig Dispense Refill  . levalbuterol (XOPENEX HFA) 45 MCG/ACT inhaler Inhale 1-2 puffs into the lungs as needed.    . linaclotide (LINZESS) 290 MCG CAPS capsule Take 290 mcg by mouth daily before breakfast.    . loratadine (CLARITIN) 10 MG tablet Take 10 mg by mouth daily.    . diclofenac (VOLTAREN) 75 MG EC tablet Take 1 tablet by mouth daily.    . hydrochlorothiazide (MICROZIDE) 12.5 MG capsule Take 12.5 mg by mouth daily.    . pramoxine (PROCTOFOAM) 1 % foam Place 1 application rectally at bedtime. (Patient not taking: Reported on 02/24/2018) 15 g 2   No current facility-administered medications on file prior to visit.      Review of Systems  Constitutional: Positive for malaise/fatigue. Negative for weight loss.  HENT: Negative for nosebleeds.   Eyes: Negative for blurred vision.  Respiratory: Positive for shortness of breath. Negative for cough and hemoptysis.   Cardiovascular: Positive for palpitations (On and off lasts few seconds throught out the day with activity). Negative for chest pain, claudication and leg swelling.  Gastrointestinal: Positive for constipation. Negative for abdominal pain, blood in stool, heartburn, nausea and vomiting.  Genitourinary: Negative for dysuria.  Musculoskeletal: Negative for joint pain and myalgias.  Skin: Negative for itching and rash.  Neurological: Negative for dizziness, focal weakness, loss of consciousness and headaches.  Endo/Heme/Allergies: Does not bruise/bleed easily.  Psychiatric/Behavioral: Negative for depression. The patient is not nervous/anxious.   All other systems reviewed and are negative.      Objective:  Blood pressure 132/61, pulse (!) 51, height 5' 7" (1.702 m), weight 190 lb 12.8 oz (86.5 kg), SpO2 99 %. Body mass index is 29.88 kg/m.  Physical Exam  Constitutional: She appears well-developed and well-nourished. No distress.  HENT:  Head:  Atraumatic.  Eyes: Conjunctivae are normal.  Neck: Neck supple. No JVD present. No thyromegaly present.  Cardiovascular: Normal rate, regular rhythm and intact distal pulses. Exam reveals no gallop.  Murmur (2/6 ejection systolic murmur in the aortic area) heard. Pulses:      Carotid pulses are on the right side with bruit and on the left side with bruit. Pulmonary/Chest: Effort normal and breath sounds normal.  Abdominal: Soft. Bowel sounds are normal.  Musculoskeletal: Normal range of motion.        General: No edema.  Neurological: She is alert.  Skin: Skin is warm and dry.  Psychiatric: She has a normal mood and affect.    Assessment &  Recommendations:   1. Atypical chest pain Lexiscan myoview stress test 05/08/2018: 1. Lexiscan stress test was performed. Exercise capacity was not assessed. No stress symptoms reported. Blood pressure was normal. The resting and stress electrocardiogram demonstrated normal sinus rhythm, normal resting conduction, no resting arrhythmias and normal rest repolarization. Stress EKG is non diagnostic for ischemia as it is a pharmacologic stress. 2. The overall quality of the study is good. Left ventricular cavity is noted to be normal on the rest and stress studies. Gated SPECT images reveal normal myocardial thickening and wall motion. The left ventricular ejection fraction was calculated or visually estimated to be 53%. Medium sized, moderate intensity, predominantly reversible perfusion defect in mid to basal inferior/Inferoseptal myocardium suggesting medium ischemia 3. Intermediate risk study. EKG 07/17/2018: Marked sinus bradycardia at the rate of 48 bpm, normal axis.  No evidence of ischemia, otherwise normal EKG.  2. Aortic valve disorder Echo 05/04/2018:  1. Left ventricle cavity is normal in size. Normal global wall motion. Normal diastolic filling pattern. Calculate EF 69%. 2. Mild to moderate aortic regurgitation. AR flow is directed  towards ant. mitral leaflet. Mild aortic valve leaflet thickening. Minimally increased peak aortic velocity, may be due to increased flow from aortic regurgitation, but, trace aortic valve stenosis can't be ruled out. 3. Mild tricuspid regurgitation. No evidence of pulmonaryhypertension. Compared to 01/2013, AI slightly progressed.  3. Palpitations Holter Monitor 48 hours 04/12/2018: Predominant rhythm is normal sinus rhythm. Maximum heart rate 132 bpm, minimum heart rate 42 bpm. Occasional PACs were noted. Occasional atrial couplets were noted. Symptoms of palpitations and irregular heartbeat revealed normal sinus rhythm. No other significant arrhythmias. No heart block  4. Essential hypertension Controlled  5. Mild hyperlipidemia Lipids are markedly improved on minimal dose of Lipitor 10 mg.  We'll continue the same.  6. Laboratory examination 06/27/2018: Cholesterol 140, triglycerides 130, HDL 65, LDL 49.  Creatinine 0.9, EGFR 70/81, potassium 4.1, CMP normal.  Labs 02/24/2018: Potassium 3.4, BUN 19, creatinine 0.84, CBC normal.  Recommendation: Patient is now on low-dose of beta blocker, although has marked bradycardia, completely asymptomatic.  It is also help her with palpitations.  Overall I feel we should continue primary prevention strategy although she has intermediate risk study by nuclear stress test.  I have recommended that she resume exercises, if she has limitations during exercise including dyspnea, chest discomfort or worsening palpitations then we'll consider coronary angiography.  I'd like to see her back in 3 months.  She has very prominent bilateral carotid artery bruit, I suspect this is due to aortic sounds, in view of mild to moderate aortic regurgitation this may be a conducted sounds.  Will obtain carotid artery duplex to exclude significant carotid stenosis.   , MD, FACC 07/15/2018, 11:36 PM [facility] Pager: [phone] Office:  [phone] If no answer Cell [phone]  

## 2018-07-17 ENCOUNTER — Encounter: Payer: Self-pay | Admitting: Cardiology

## 2018-07-17 ENCOUNTER — Ambulatory Visit (INDEPENDENT_AMBULATORY_CARE_PROVIDER_SITE_OTHER): Payer: Medicare Other | Admitting: Cardiology

## 2018-07-17 VITALS — BP 132/61 | HR 51 | Ht 67.0 in | Wt 190.8 lb

## 2018-07-17 DIAGNOSIS — R002 Palpitations: Secondary | ICD-10-CM | POA: Diagnosis not present

## 2018-07-17 DIAGNOSIS — Z0189 Encounter for other specified special examinations: Secondary | ICD-10-CM | POA: Diagnosis not present

## 2018-07-17 DIAGNOSIS — E785 Hyperlipidemia, unspecified: Secondary | ICD-10-CM

## 2018-07-17 DIAGNOSIS — R0989 Other specified symptoms and signs involving the circulatory and respiratory systems: Secondary | ICD-10-CM | POA: Diagnosis not present

## 2018-07-17 DIAGNOSIS — R079 Chest pain, unspecified: Secondary | ICD-10-CM

## 2018-07-17 DIAGNOSIS — R0789 Other chest pain: Secondary | ICD-10-CM

## 2018-07-17 DIAGNOSIS — I10 Essential (primary) hypertension: Secondary | ICD-10-CM | POA: Diagnosis not present

## 2018-07-17 DIAGNOSIS — I359 Nonrheumatic aortic valve disorder, unspecified: Secondary | ICD-10-CM

## 2018-07-17 MED ORDER — ATORVASTATIN CALCIUM 10 MG PO TABS: 10.0000 mg | ORAL_TABLET | Freq: Every day | ORAL | 2 refills | Status: DC

## 2018-07-17 MED ORDER — ATENOLOL 25 MG PO TABS: 25.0000 mg | ORAL_TABLET | Freq: Two times a day (BID) | ORAL | 1 refills | Status: DC

## 2018-07-24 ENCOUNTER — Ambulatory Visit
Admission: RE | Admit: 2018-07-24 | Discharge: 2018-07-24 | Disposition: A | Discharge status: Home / Self Care | Payer: Medicare Other | Source: Ambulatory Visit | Attending: Internal Medicine | Admitting: Internal Medicine

## 2018-07-24 DIAGNOSIS — Z1231 Encounter for screening mammogram for malignant neoplasm of breast: Secondary | ICD-10-CM | POA: Diagnosis not present

## 2018-07-25 ENCOUNTER — Encounter: Payer: Self-pay | Admitting: Cardiology

## 2018-07-25 ENCOUNTER — Telehealth: Payer: Self-pay

## 2018-07-25 NOTE — Telephone Encounter (Signed)
Pt called stating she needs a letter from you giving information about her health and diagnosis, and how it affects her daily living. She needs this information for court.  Wants letter by Thursday.  Please Advise.  Thank you!

## 2018-07-27 DIAGNOSIS — E1351 Other specified diabetes mellitus with diabetic peripheral angiopathy without gangrene: Secondary | ICD-10-CM | POA: Diagnosis not present

## 2018-07-27 DIAGNOSIS — M21961 Unspecified acquired deformity of right lower leg: Secondary | ICD-10-CM | POA: Diagnosis not present

## 2018-07-27 DIAGNOSIS — M21611 Bunion of right foot: Secondary | ICD-10-CM | POA: Diagnosis not present

## 2018-07-27 DIAGNOSIS — M21612 Bunion of left foot: Secondary | ICD-10-CM | POA: Diagnosis not present

## 2018-07-27 DIAGNOSIS — G629 Polyneuropathy, unspecified: Secondary | ICD-10-CM | POA: Diagnosis not present

## 2018-07-27 DIAGNOSIS — G603 Idiopathic progressive neuropathy: Secondary | ICD-10-CM | POA: Diagnosis not present

## 2018-08-01 ENCOUNTER — Other Ambulatory Visit: Payer: Self-pay | Admitting: Cardiology

## 2018-08-01 DIAGNOSIS — R002 Palpitations: Secondary | ICD-10-CM

## 2018-08-02 ENCOUNTER — Other Ambulatory Visit: Payer: Medicare Other

## 2018-08-09 ENCOUNTER — Other Ambulatory Visit: Payer: Self-pay

## 2018-08-09 ENCOUNTER — Ambulatory Visit: Payer: Medicare Other

## 2018-08-09 DIAGNOSIS — R0989 Other specified symptoms and signs involving the circulatory and respiratory systems: Secondary | ICD-10-CM

## 2018-08-09 DIAGNOSIS — G629 Polyneuropathy, unspecified: Secondary | ICD-10-CM | POA: Diagnosis not present

## 2018-08-14 NOTE — Progress Notes (Signed)
Pt aware of results and pending appt.//ah

## 2018-08-17 DIAGNOSIS — M21612 Bunion of left foot: Secondary | ICD-10-CM | POA: Diagnosis not present

## 2018-08-17 DIAGNOSIS — G603 Idiopathic progressive neuropathy: Secondary | ICD-10-CM | POA: Diagnosis not present

## 2018-08-17 DIAGNOSIS — M21611 Bunion of right foot: Secondary | ICD-10-CM | POA: Diagnosis not present

## 2018-09-05 ENCOUNTER — Other Ambulatory Visit: Payer: Self-pay

## 2018-09-05 DIAGNOSIS — E785 Hyperlipidemia, unspecified: Secondary | ICD-10-CM

## 2018-09-05 MED ORDER — ATORVASTATIN CALCIUM 10 MG PO TABS: 10.0000 mg | ORAL_TABLET | Freq: Every day | ORAL | 2 refills | Status: DC

## 2018-10-16 ENCOUNTER — Encounter: Payer: Self-pay | Admitting: Cardiology

## 2018-10-16 ENCOUNTER — Other Ambulatory Visit: Payer: Self-pay

## 2018-10-16 ENCOUNTER — Ambulatory Visit (INDEPENDENT_AMBULATORY_CARE_PROVIDER_SITE_OTHER): Payer: Medicare Other | Admitting: Cardiology

## 2018-10-16 VITALS — BP 120/76 | Ht 67.0 in | Wt 180.0 lb

## 2018-10-16 DIAGNOSIS — E7800 Pure hypercholesterolemia, unspecified: Secondary | ICD-10-CM

## 2018-10-16 DIAGNOSIS — I209 Angina pectoris, unspecified: Secondary | ICD-10-CM | POA: Diagnosis not present

## 2018-10-16 DIAGNOSIS — R002 Palpitations: Secondary | ICD-10-CM | POA: Diagnosis not present

## 2018-10-16 DIAGNOSIS — I351 Nonrheumatic aortic (valve) insufficiency: Secondary | ICD-10-CM

## 2018-10-16 DIAGNOSIS — R0609 Other forms of dyspnea: Secondary | ICD-10-CM | POA: Diagnosis not present

## 2018-10-16 DIAGNOSIS — R0989 Other specified symptoms and signs involving the circulatory and respiratory systems: Secondary | ICD-10-CM

## 2018-10-16 DIAGNOSIS — R0600 Dyspnea, unspecified: Secondary | ICD-10-CM

## 2018-10-16 NOTE — Progress Notes (Addendum)
Virtual Visit via Video Note: This visit type was conducted due to national recommendations for restrictions regarding the COVID-19 Pandemic (e.g. social distancing).  This format is felt to be most appropriate for this patient at this time.  All issues noted in this document were discussed and addressed.  No physical exam was performed (except for noted visual exam findings with Telehealth visits).  The patient has consented to conduct a Telehealth visit and understands insurance will be billed.   I connected with@, on 10/17/18 at  by a video enabled telemedicine application and verified that I am speaking with the correct person using two identifiers.   I discussed the limitations of evaluation and management by telemedicine and the availability of in person appointments. The patient expressed understanding and agreed to proceed.   I have discussed with patient regarding the safety during COVID Pandemic and steps and precautions to be taken including social distancing, frequent hand wash and use of detergent soap, gels with the patient. I asked the patient to avoid touching mouth, nose, eyes, ears with the hands. I encouraged regular walking around the neighborhood and exercise and regular diet, as long as social distancing can be maintained.  Primary Physician/Referring:  Gevena Mart, DO  Patient ID: Carly Paul, female    DOB: 1961-05-07, 58 y.o.   MRN: [id number]  Chief Complaint  Patient presents with   Chest Pain    follow up   Palpitations    HPI: Carly Paul  is a 58 y.o. female  with h/o iron deficiency anemia, hyperlipidemia, Crohn's disease diagnosed in 2006, hypertension, history of for  MRI negative TIAs from 2009 until 2014.  She has had sudden onset of weakness in the right leg and was diagnosed with having stroke at [facility], also in [place]. However, MRI and MRA of the brain performed 10/11/2012 was completely normal except for scattered foci of white matter signal  in the frontal lobe suggestive of early manifestation of small vessel disease or could be related to previous trauma or migraine.   This is her 3 month visit to evaluate chest pain and palpitations. She has chronic palpitations, exertional chest pain and dyspnea.   She has noticed improvement in palpitations since being on atenolol. She has chronic asymptomatic bradycardia. She is tolerating atorvastatin without side effects.  with regard to exertional chest pain, since she started exercising 3 months ago, has noticed improvement in chest pain.  Overall she feels well.    Past Medical History:  Diagnosis Date   Abnormal Pap smear of cervix 1995   had cryo done   Allergy    sulfa and eggs   Aortic insufficiency    Aortic valve disorder 07/15/2018   Arthritis    osteoarthritis   Atypical chest pain    Crohn's disease ([place])    Herniated disc    Hyperlipidemia    Hypertension    IBS (irritable bowel syndrome)    Mitral valve prolapse    Osteoporosis    Palpitations 07/15/2018   Shortness of breath     Past Surgical History:  Procedure Laterality Date   ABSCESS DRAINAGE     GI   BLADDER SUSPENSION     LAPAROSCOPIC SUPRACERVICAL HYSTERECTOMY     VAGINAL HYSTERECTOMY      Social History   Socioeconomic History   Marital status: Single    Spouse name: Not on file   Number of children: 2   Years of education: Not on file  Highest education level: Not on file  °Occupational History  °• Not on file  °Social Needs  °• Financial resource strain: Not on file  °• Food insecurity:  °  Worry: Not on file  °  Inability: Not on file  °• Transportation needs:  °  Medical: Not on file  °  Non-medical: Not on file  °Tobacco Use  °• Smoking status: Former Smoker  °  Packs/day: 0.25  °  Years: 1.00  °  Pack years: 0.25  °  Types: Cigarettes  °  Last attempt to quit: 07/12/2010  °  Years since quitting: 8.2  °• Smokeless tobacco: Never Used  °• Tobacco comment: Pt only smoked  1-2 cigarettes per day  °Substance and Sexual Activity  °• Alcohol use: No  °• Drug use: No  °• Sexual activity: Not Currently  °  Birth control/protection: Surgical  °Lifestyle  °• Physical activity:  °  Days per week: Not on file  °  Minutes per session: Not on file  °• Stress: Not on file  °Relationships  °• Social connections:  °  Talks on phone: Not on file  °  Gets together: Not on file  °  Attends religious service: Not on file  °  Active member of club or organization: Not on file  °  Attends meetings of clubs or organizations: Not on file  °  Relationship status: Not on file  °• Intimate partner violence:  °  Fear of current or ex partner: Not on file  °  Emotionally abused: Not on file  °  Physically abused: Not on file  °  Forced sexual activity: Not on file  °Other Topics Concern  °• Not on file  °Social History Narrative  ° Lives with housemate.   ° ° °Review of Systems  °Constitution: Negative for chills, decreased appetite, malaise/fatigue and weight gain.  °Cardiovascular: Positive for chest pain (rare), dyspnea on exertion (improved) and palpitations (stable). Negative for leg swelling and syncope.  °Endocrine: Negative for cold intolerance.  °Hematologic/Lymphatic: Does not bruise/bleed easily.  °Musculoskeletal: Negative for joint swelling.  °Gastrointestinal: Positive for change in bowel habit (IBS). Negative for abdominal pain, anorexia, hematochezia and melena.  °Neurological: Positive for headaches (occasional). Negative for light-headedness.  °Psychiatric/Behavioral: Negative for depression and substance abuse.  °All other systems reviewed and are negative. ° °   °Objective  °Blood pressure 120/76, height 5' 7" (1.702 m), weight 180 lb (81.6 kg). Body mass index is 28.19 kg/m². °   °Physical Exam  °Constitutional: She appears well-developed and well-nourished. No distress.  °HENT:  °Head: Atraumatic.  °Eyes: Conjunctivae are normal.  °Neck: Neck supple. No JVD present. No thyromegaly present.   °Cardiovascular: Normal rate, regular rhythm and intact distal pulses. Exam reveals no gallop.  °Murmur (2/6 ejection systolic murmur in the aortic area) heard. °Pulses: °     Carotid pulses are on the right side with bruit and on the left side with bruit. °Pulmonary/Chest: Effort normal and breath sounds normal.  °Abdominal: Soft. Bowel sounds are normal.  °Musculoskeletal: Normal range of motion.     °   General: No edema.  °Neurological: She is alert.  °Skin: Skin is warm and dry.  °Psychiatric: She has a normal mood and affect.  ° °Radiology: °No results found. ° °Laboratory examination:  ° °06/27/2018: Cholesterol 140, triglycerides 130, HDL 65, LDL 49.  Creatinine 0.9, EGFR 70/81, potassium 4.1, CMP normal. 07/2018: A.c 6.2 ° °CMP Latest Ref Rng & Units   02/23/2018 10/11/2012 10/11/2012  Glucose 70 - 99 mg/dL 136(H) 93 95  BUN 6 - 20 mg/dL _0 Creatinine 0.44 - 1.00 mg/dL 0.84 1.00 1.00  Sodium 135 - 145 mmol/L 142 142 141  Potassium 3.5 - 5.1 mmol/L 3.4(L) 3.9 3.9  Chloride 98 - 111 mmol/L 105 102 105  CO2 22 - 32 mmol/L 28 29 -  Calcium 8.9 - 10.3 mg/dL 9.2 9.5 -  Total Protein 6.0 - 8.3 g/dL - 7.8 -  Total Bilirubin 0.3 - 1.2 mg/dL - 0.2(L) -  Alkaline Phos 39 - 117 U/L - 50 -  AST 0 - 37 U/L - 29 -  ALT 0 - 35 U/L - 28 -   CBC Latest Ref Rng & Units 02/23/2018 10/11/2012 10/11/2012  WBC 4.0 - 10.5 K/uL 6.6 8.1 -  Hemoglobin 12.0 - 15.0 g/dL 12.5 13.0 14.6  Hematocrit 36.0 - 46.0 % 38.1 39.7 43.0  Platelets 150 - 400 K/uL 248 290 -   PRN Meds:. Medications Discontinued During This Encounter  Medication Reason   diclofenac (VOLTAREN) 75 MG EC tablet Completed Course   hydrochlorothiazide (MICROZIDE) 12.5 MG capsule Discontinued by provider   pramoxine (PROCTOFOAM) 1 % foam Discontinued by provider   Current Meds  Medication Sig   atenolol (TENORMIN) 25 MG tablet TAKE 1 TABLET BY MOUTH TWICE A DAY   atorvastatin (LIPITOR) 10 MG tablet Take 1 tablet (10 mg total) by mouth  daily.   levalbuterol (XOPENEX HFA) 45 MCG/ACT inhaler Inhale 1-2 puffs into the lungs as needed.   linaclotide (LINZESS) 290 MCG CAPS capsule Take 290 mcg by mouth daily before breakfast.   loratadine (CLARITIN) 10 MG tablet Take 10 mg by mouth daily.   Probiotic Product (PRO-BIOTIC BLEND) CAPS Take 1 capsule by mouth daily.    Cardiac Studies:   Lexiscan myoview stress test 05/08/2018: 1. Lexiscan stress test was performed. Exercise capacity was not assessed. No stress symptoms reported. Blood pressure was normal.  The resting and stress electrocardiogram demonstrated normal sinus rhythm, normal resting conduction, no resting arrhythmias and normal rest repolarization.  Stress EKG is non diagnostic for ischemia as it is a pharmacologic stress. 2. The overall quality of the study is good.  Left ventricular cavity is noted to be normal on the rest and stress studies.  Gated SPECT images reveal normal myocardial thickening and wall motion.  The left ventricular ejection fraction was calculated or visually estimated to be 53%. Medium sized, moderate intensity, predominantly reversible perfusion defect in mid to basal inferior/Inferoseptal myocardium suggesting medium ischemia 3. Intermediate risk study.  Echo 05/04/2018:  1. Left ventricle cavity is normal in size. Normal global wall motion. Normal diastolic filling pattern. Calculate EF 69%. 2. Mild to moderate aortic regurgitation. AR flow is directed towards ant. mitral leaflet. Mild aortic valve leaflet thickening. Minimally increased peak aortic velocity, may be due to increased flow from aortic regurgitation, but, trace aortic valve stenosis can't be ruled out. 3. Mild tricuspid regurgitation. No evidence of pulmonaryhypertension. Compared to 01/2013, AI slightly progressed.  Holter Monitor 48 hours 04/12/2018: Predominant rhythm is normal sinus rhythm. Maximum heart rate 132 bpm, minimum heart rate 42 bpm. Occasional PACs were noted.  Occasional atrial couplets were noted. Symptoms of palpitations and irregular heartbeat revealed normal sinus rhythm. No other significant arrhythmias. No heart block  Carotid artery duplex  08/09/2018 Minimal stenosis in the left internal carotid artery (1-15%). Right carotid appears normal.  Antegrade right vertebral artery flow. Antegrade left vertebral  artery flow. ° °Assessment  ° °Palpitations ° °Angina pectoris (HCC) ° °Dyspnea on exertion ° °Hypercholesteremia ° °Bilateral carotid bruits ° °Moderate aortic regurgitation ° °EKG 07/17/2018: Marked sinus bradycardia at the rate of 48 bpm, normal axis.  No evidence of ischemia, otherwise normal EKG. ° °Recommendations:  ° °Recommendation: Patient  has intermediate risk study by nuclear stress test. Diabetes is also well controlled. ° °She is made lifestyle changes, has not had any recurrence of chest pain, hence I would recommend continued medical therapy and primary prevention. ° °The blood pressure now well controlled. She has very prominent bilateral carotid artery bruit, however the carotid duplex does not revea l any significant stenosis, suspect conducted sounds from AV disease. With regard to moderate aortic regurgitation, no endocarditis prophylaxis indicated. ° °Lipids are markedly improved on minimal dose of Lipitor 10 mg.  We'll continue the same. I'll see her back in 6 months. ° ° ° , MD, FACC °10/17/2018, 3:30 AM °[facility] °Pager: [phone] °Office: [phone] °If no answer Cell [phone] °

## 2018-10-17 ENCOUNTER — Encounter: Payer: Self-pay | Admitting: Cardiology

## 2018-10-18 ENCOUNTER — Ambulatory Visit: Payer: Medicare Other | Admitting: Cardiology

## 2018-10-24 ENCOUNTER — Encounter: Payer: Self-pay | Admitting: Cardiology

## 2018-10-24 NOTE — Progress Notes (Signed)
Labs 10/12/2018: BUN 23, creatinine 1.29, potassium 5.5, CMP normal 7 mg.  Total cholesterol 107, triglycerides 54, HDL 47, LDL 49.  HB 2.2/HCT 87.6, platelets 243.  TSH normal.  A1c 7.3%.  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

## 2018-10-25 DIAGNOSIS — E7800 Pure hypercholesterolemia, unspecified: Secondary | ICD-10-CM | POA: Diagnosis not present

## 2018-10-25 DIAGNOSIS — I10 Essential (primary) hypertension: Secondary | ICD-10-CM | POA: Diagnosis not present

## 2018-10-25 DIAGNOSIS — E119 Type 2 diabetes mellitus without complications: Secondary | ICD-10-CM | POA: Diagnosis not present

## 2018-10-25 DIAGNOSIS — M158 Other polyosteoarthritis: Secondary | ICD-10-CM | POA: Diagnosis not present

## 2018-10-25 DIAGNOSIS — Z0001 Encounter for general adult medical examination with abnormal findings: Secondary | ICD-10-CM | POA: Diagnosis not present

## 2018-10-25 DIAGNOSIS — J45909 Unspecified asthma, uncomplicated: Secondary | ICD-10-CM | POA: Diagnosis not present

## 2018-10-29 ENCOUNTER — Other Ambulatory Visit: Payer: Self-pay | Admitting: Cardiology

## 2018-10-29 DIAGNOSIS — R002 Palpitations: Secondary | ICD-10-CM

## 2018-10-30 NOTE — Telephone Encounter (Signed)
Please fill

## 2018-11-20 DIAGNOSIS — R140 Abdominal distension (gaseous): Secondary | ICD-10-CM | POA: Diagnosis not present

## 2018-11-20 DIAGNOSIS — K5090 Crohn's disease, unspecified, without complications: Secondary | ICD-10-CM | POA: Diagnosis not present

## 2018-11-20 DIAGNOSIS — K5900 Constipation, unspecified: Secondary | ICD-10-CM | POA: Diagnosis not present

## 2018-11-22 ENCOUNTER — Encounter: Payer: Self-pay | Admitting: Cardiology

## 2019-01-03 DIAGNOSIS — E119 Type 2 diabetes mellitus without complications: Secondary | ICD-10-CM | POA: Diagnosis not present

## 2019-01-03 DIAGNOSIS — M158 Other polyosteoarthritis: Secondary | ICD-10-CM | POA: Diagnosis not present

## 2019-01-03 DIAGNOSIS — E7800 Pure hypercholesterolemia, unspecified: Secondary | ICD-10-CM | POA: Diagnosis not present

## 2019-01-03 DIAGNOSIS — I10 Essential (primary) hypertension: Secondary | ICD-10-CM | POA: Diagnosis not present

## 2019-01-03 DIAGNOSIS — J45909 Unspecified asthma, uncomplicated: Secondary | ICD-10-CM | POA: Diagnosis not present

## 2019-02-06 DIAGNOSIS — K5730 Diverticulosis of large intestine without perforation or abscess without bleeding: Secondary | ICD-10-CM | POA: Diagnosis not present

## 2019-02-06 DIAGNOSIS — K5090 Crohn's disease, unspecified, without complications: Secondary | ICD-10-CM | POA: Diagnosis not present

## 2019-02-06 DIAGNOSIS — K621 Rectal polyp: Secondary | ICD-10-CM | POA: Diagnosis not present

## 2019-02-06 DIAGNOSIS — K635 Polyp of colon: Secondary | ICD-10-CM | POA: Diagnosis not present

## 2019-02-06 DIAGNOSIS — K633 Ulcer of intestine: Secondary | ICD-10-CM | POA: Diagnosis not present

## 2019-02-06 DIAGNOSIS — K5010 Crohn's disease of large intestine without complications: Secondary | ICD-10-CM | POA: Diagnosis not present

## 2019-04-06 DIAGNOSIS — I10 Essential (primary) hypertension: Secondary | ICD-10-CM | POA: Diagnosis not present

## 2019-04-06 DIAGNOSIS — E7800 Pure hypercholesterolemia, unspecified: Secondary | ICD-10-CM | POA: Diagnosis not present

## 2019-04-06 DIAGNOSIS — E119 Type 2 diabetes mellitus without complications: Secondary | ICD-10-CM | POA: Diagnosis not present

## 2019-04-06 DIAGNOSIS — M158 Other polyosteoarthritis: Secondary | ICD-10-CM | POA: Diagnosis not present

## 2019-04-06 DIAGNOSIS — J0101 Acute recurrent maxillary sinusitis: Secondary | ICD-10-CM | POA: Diagnosis not present

## 2019-04-06 DIAGNOSIS — J45909 Unspecified asthma, uncomplicated: Secondary | ICD-10-CM | POA: Diagnosis not present

## 2019-04-19 ENCOUNTER — Ambulatory Visit (INDEPENDENT_AMBULATORY_CARE_PROVIDER_SITE_OTHER): Payer: Medicare Other | Admitting: Cardiology

## 2019-04-19 ENCOUNTER — Encounter: Payer: Self-pay | Admitting: Cardiology

## 2019-04-19 ENCOUNTER — Other Ambulatory Visit: Payer: Self-pay

## 2019-04-19 VITALS — BP 133/73 | HR 47 | Ht 67.0 in | Wt 177.0 lb

## 2019-04-19 DIAGNOSIS — I351 Nonrheumatic aortic (valve) insufficiency: Secondary | ICD-10-CM | POA: Diagnosis not present

## 2019-04-19 DIAGNOSIS — R0600 Dyspnea, unspecified: Secondary | ICD-10-CM

## 2019-04-19 DIAGNOSIS — R002 Palpitations: Secondary | ICD-10-CM | POA: Diagnosis not present

## 2019-04-19 DIAGNOSIS — R9431 Abnormal electrocardiogram [ECG] [EKG]: Secondary | ICD-10-CM | POA: Diagnosis not present

## 2019-04-19 DIAGNOSIS — E7800 Pure hypercholesterolemia, unspecified: Secondary | ICD-10-CM

## 2019-04-19 DIAGNOSIS — I209 Angina pectoris, unspecified: Secondary | ICD-10-CM | POA: Diagnosis not present

## 2019-04-19 DIAGNOSIS — E119 Type 2 diabetes mellitus without complications: Secondary | ICD-10-CM

## 2019-04-19 DIAGNOSIS — R0609 Other forms of dyspnea: Secondary | ICD-10-CM

## 2019-04-19 NOTE — Progress Notes (Signed)
Primary Physician/Referring:  Trey Sailors, PA  Patient ID: [name], female    DOB: March 28, 1961, 58 y.o.   MRN: [id number]  Chief Complaint  Patient presents with  . Chest Pain    follow up  . Palpitations   HPI:    Carly Paul  is a 58 y.o. AAF patient  with h/o iron deficiency anemia, hyperlipidemia, hypertension, DM, mild to moderate aortic regurgitation, Crohn's disease, hypertension, history of for  MRI negative TIAs from 2009 until 2014 presenting with right leg weakness and diagnosed with frontal lobe suggestive of early manifestation of small vessel disease or could be related to previous trauma or migraine.   This is her 6 month visit to evaluate chest pain and palpitations. She has chronic palpitations, denies any further exertional chest pain. She does have chronic dyspnea due to allergies.     She has noticed improvement in palpitations with regards to skipped beats but has noticed occasional rapid heart beats but not very fast when she first lays down.  She has chronic asymptomatic bradycardia. She is tolerating atorvastatin without side effects.   She is disabled from work due to IBS and Crohns.   Past Medical History:  Diagnosis Date  . Abnormal Pap smear of cervix 1995   had cryo done  . Allergy    sulfa and eggs  . Aortic insufficiency   . Aortic valve disorder 07/15/2018  . Arthritis    osteoarthritis  . Atypical chest pain   . Crohn's disease ([place])   . Herniated disc   . Hyperlipidemia   . Hypertension   . IBS (irritable bowel syndrome)   . Mitral valve prolapse   . Osteoporosis   . Palpitations 07/15/2018  . Shortness of breath    Past Surgical History:  Procedure Laterality Date  . ABSCESS DRAINAGE     GI  . BLADDER SUSPENSION    . LAPAROSCOPIC SUPRACERVICAL HYSTERECTOMY    . VAGINAL HYSTERECTOMY     Social History   Socioeconomic History  . Marital status: Single    Spouse name: Not on file  . Number of children: 2  .  Years of education: Not on file  . Highest education level: Not on file  Occupational History  . Not on file  Social Needs  . Financial resource strain: Not on file  . Food insecurity    Worry: Not on file    Inability: Not on file  . Transportation needs    Medical: Not on file    Non-medical: Not on file  Tobacco Use  . Smoking status: Former Smoker    Packs/day: 0.25    Years: 1.00    Pack years: 0.25    Types: Cigarettes    Quit date: 07/12/2010    Years since quitting: 8.7  . Smokeless tobacco: Never Used  . Tobacco comment: Pt only smoked 1-2 cigarettes per day  Substance and Sexual Activity  . Alcohol use: No  . Drug use: No  . Sexual activity: Not Currently    Birth control/protection: Surgical  Lifestyle  . Physical activity    Days per week: Not on file    Minutes per session: Not on file  . Stress: Not on file  Relationships  . Social Herbalist on phone: Not on file    Gets together: Not on file    Attends religious service: Not on file    Active member of club or organization: Not on  file    Attends meetings of clubs or organizations: Not on file    Relationship status: Not on file  . Intimate partner violence    Fear of current or ex partner: Not on file    Emotionally abused: Not on file    Physically abused: Not on file    Forced sexual activity: Not on file  Other Topics Concern  . Not on file  Social History Narrative   Lives with housemate.    ROS  Review of Systems  Constitution: Negative for chills, decreased appetite, malaise/fatigue and weight gain.  Cardiovascular: Positive for chest pain (rare), dyspnea on exertion (improved) and palpitations (stable). Negative for leg swelling and syncope.  Endocrine: Negative for cold intolerance.  Hematologic/Lymphatic: Does not bruise/bleed easily.  Musculoskeletal: Negative for joint swelling.  Gastrointestinal: Positive for change in bowel habit (IBS). Negative for abdominal pain,  anorexia, hematochezia and melena.  Neurological: Positive for headaches (occasional). Negative for light-headedness.  Psychiatric/Behavioral: Negative for depression and substance abuse.  All other systems reviewed and are negative.  Objective   Vitals with BMI 04/19/2019 10/16/2018 07/17/2018  Height 5\' 7"  5\' 7"  5\' 7"   Weight 177 lbs 180 lbs 190 lbs 13 oz  BMI 27.72 28.19 29.88  Systolic 153 120 161132  Diastolic 73 76 61  Pulse 47 - 51     Physical Exam  Constitutional: She appears well-developed and well-nourished. No distress.  HENT:  Head: Atraumatic.  Eyes: Conjunctivae are normal.  Neck: Neck supple. No JVD present. No thyromegaly present.  Cardiovascular: Normal rate, regular rhythm and intact distal pulses. Exam reveals no gallop.  Murmur (2/6 ejection systolic murmur in the aortic area) heard. Pulses:      Carotid pulses are on the right side with bruit and on the left side with bruit. Pulmonary/Chest: Effort normal and breath sounds normal.  Abdominal: Soft. Bowel sounds are normal.  Musculoskeletal: Normal range of motion.        General: No edema.  Neurological: She is alert.  Skin: Skin is warm and dry.  Psychiatric: She has a normal mood and affect.   Laboratory examination:   Labs 10/12/2018: BUN 23, creatinine 1.29, potassium 5.5, CMP normal 7 mg.  Total cholesterol 107, triglycerides 54, HDL 47, LDL 49.  HB 2.2/HCT 87.6, platelets 243.  TSH normal.  A1c 7.3%.  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  No results for input(s): NA, K, CL, CO2, GLUCOSE, BUN, CREATININE, CALCIUM, GFRNONAA, GFRAA in the last 8760 hours. CrCl cannot be calculated (Patient's most recent lab result is older than the maximum 21 days allowed.).  CMP Latest Ref Rng & Units 02/23/2018 10/11/2012 10/11/2012  Glucose 70 - 99 mg/dL 096(E136(H) 93 95  BUN 6 - 20 mg/dL 19 13 15   Creatinine 0.44 - 1.00 mg/dL 4.540.84 0.981.00 1.191.00  Sodium 135  - 145 mmol/L 142 142 141  Potassium 3.5 - 5.1 mmol/L 3.4(L) 3.9 3.9  Chloride 98 - 111 mmol/L 105 102 105  CO2 22 - 32 mmol/L 28 29 -  Calcium 8.9 - 10.3 mg/dL 9.2 9.5 -  Total Protein 6.0 - 8.3 g/dL - 7.8 -  Total Bilirubin 0.3 - 1.2 mg/dL - 1.4(N0.2(L) -  Alkaline Phos 39 - 117 U/L - 50 -  AST 0 - 37 U/L - 29 -  ALT 0 - 35 U/L - 28 -   CBC Latest Ref Rng & Units 02/23/2018 10/11/2012 10/11/2012  WBC 4.0 -  10.5 K/uL 6.6 8.1 -  Hemoglobin 12.0 - 15.0 g/dL 69.4 85.4 62.7  Hematocrit 36.0 - 46.0 % 38.1 39.7 43.0  Platelets 150 - 400 K/uL 248 290 -   Lipid Panel  No results found for: CHOL, TRIG, HDL, CHOLHDL, VLDL, LDLCALC, LDLDIRECT HEMOGLOBIN A1C No results found for: HGBA1C, MPG TSH No results for input(s): TSH in the last 8760 hours. Medications and allergies   Allergies  Allergen Reactions  . Contrast Media [Iodinated Diagnostic Agents]   . Lovastatin   . Sulfa Antibiotics Swelling  . Eggs Or Egg-Derived Products Rash     Current Outpatient Medications  Medication Instructions  . atenolol (TENORMIN) 25 MG tablet TAKE 1 TABLET BY MOUTH TWICE A DAY  . levalbuterol (XOPENEX HFA) 45 MCG/ACT inhaler 1-2 puffs, Inhalation, As needed  . linaclotide (LINZESS) 290 mcg, Oral, Daily before breakfast  . loratadine (CLARITIN) 10 mg, Oral, Daily  . predniSONE 1 MG TBEC Oral  . Probiotic Product (PRO-BIOTIC BLEND) CAPS 1 capsule, Oral, Daily    Radiology:  No results found. Cardiac Studies:   Lexiscan myoview stress test 05/08/2018: 1. Lexiscan stress test was performed. Exercise capacity was not assessed. No stress symptoms reported. Blood pressure was normal.  The resting and stress electrocardiogram demonstrated normal sinus rhythm, normal resting conduction, no resting arrhythmias and normal rest repolarization.  Stress EKG is non diagnostic for ischemia as it is a pharmacologic stress. 2. The overall quality of the study is good.  Left ventricular cavity is noted to be normal on the  rest and stress studies.  Gated SPECT images reveal normal myocardial thickening and wall motion.  The left ventricular ejection fraction was calculated or visually estimated to be 53%. Medium sized, moderate intensity, predominantly reversible perfusion defect in mid to basal inferior/Inferoseptal myocardium suggesting medium ischemia 3. Intermediate risk study.  Echo 05/04/2018:  1. Left ventricle cavity is normal in size. Normal global wall motion. Normal diastolic filling pattern. Calculate EF 69%. 2. Mild to moderate aortic regurgitation. AR flow is directed towards ant. mitral leaflet. Mild aortic valve leaflet thickening. Minimally increased peak aortic velocity, may be due to increased flow from aortic regurgitation, but, trace aortic valve stenosis can't be ruled out. 3. Mild tricuspid regurgitation. No evidence of pulmonaryhypertension. Compared to 01/2013, AI slightly progressed.  Holter Monitor 48 hours 04/12/2018: Predominant rhythm is normal sinus rhythm. Maximum heart rate 132 bpm, minimum heart rate 42 bpm. Occasional PACs were noted. Occasional atrial couplets were noted. Symptoms of palpitations and irregular heartbeat revealed normal sinus rhythm. No other significant arrhythmias. No heart block  Carotid artery duplex  08/09/2018 Minimal stenosis in the left internal carotid artery (1-15%). Right carotid appears normal.  Antegrade right vertebral artery flow. Antegrade left vertebral artery flow.  Assessment     ICD-10-CM   1. Palpitations  R00.2   2. Shortened PR interval  R94.31   3. Moderate aortic regurgitation  I35.1   4. Angina pectoris (HCC)  I20.9 EKG 12-Lead  5. Dyspnea on exertion  R06.00   6. Hypercholesteremia  E78.00   7. New onset type 2 diabetes mellitus (HCC)  E11.9     EKG 04/19/2019: Marked sinus bradycardia at rate of 44 bpm with short PR interval.  Otherwise normal EKG.  No significant change from  EKG 07/17/2018: Marked sinus bradycardia at the rate of  48 bpm, normal axis  Recommendations:  No orders of the defined types were placed in this encounter.  Carly Paul  is a 58  y.o. AAF patient  with h/o iron deficiency anemia, hyperlipidemia, hypertension, DM, mild to moderate aortic regurgitation, Crohn's disease, hypertension, history of for  MRI negative TIAs from 2009 until 2014 presenting with right leg weakness and diagnosed with frontal lobe suggestive of early manifestation of small vessel disease or could be related to previous trauma or migraine.   This is her 6 month visit to evaluate chest pain and palpitations.  Symptoms of palpitations have improved but still has rapid heart beats at night occasionally. I have reassured her for now.  Angina has improved since being on atenolol. Dyspnea is remained stable, since having made lifestyle changes, she has not had any recurrence of exertional chest discomfort.  Although has intermediate risk stress test, continued medical therapy is recommended. Her labs including lipids normal now except diabetes is still uncontrolled.  She will need repeat echocardiogram for f/u AV disease prior to her next office visit in one year.  Yates DecampJay Ailie Gage, MD, [facility] CampusFACC 04/19/2019, 9:28 AM [facility] Pager: [phone] Office: [phone] If no answer Cell [phone]

## 2019-05-24 ENCOUNTER — Other Ambulatory Visit: Payer: Self-pay | Admitting: Cardiology

## 2019-05-24 DIAGNOSIS — E785 Hyperlipidemia, unspecified: Secondary | ICD-10-CM

## 2019-07-02 ENCOUNTER — Other Ambulatory Visit: Payer: Self-pay | Admitting: Cardiology

## 2019-07-02 DIAGNOSIS — R002 Palpitations: Secondary | ICD-10-CM

## 2019-07-13 DIAGNOSIS — E7800 Pure hypercholesterolemia, unspecified: Secondary | ICD-10-CM | POA: Diagnosis not present

## 2019-07-13 DIAGNOSIS — M158 Other polyosteoarthritis: Secondary | ICD-10-CM | POA: Diagnosis not present

## 2019-07-13 DIAGNOSIS — I10 Essential (primary) hypertension: Secondary | ICD-10-CM | POA: Diagnosis not present

## 2019-07-13 DIAGNOSIS — E119 Type 2 diabetes mellitus without complications: Secondary | ICD-10-CM | POA: Diagnosis not present

## 2019-07-13 DIAGNOSIS — J45909 Unspecified asthma, uncomplicated: Secondary | ICD-10-CM | POA: Diagnosis not present

## 2019-07-25 ENCOUNTER — Other Ambulatory Visit: Payer: Self-pay | Admitting: Physician Assistant

## 2019-07-25 DIAGNOSIS — Z1231 Encounter for screening mammogram for malignant neoplasm of breast: Secondary | ICD-10-CM

## 2019-08-28 ENCOUNTER — Ambulatory Visit
Admission: RE | Admit: 2019-08-28 | Discharge: 2019-08-28 | Disposition: A | Discharge status: Home / Self Care | Payer: Medicare Other | Source: Ambulatory Visit | Attending: Physician Assistant | Admitting: Physician Assistant

## 2019-08-28 ENCOUNTER — Other Ambulatory Visit: Payer: Self-pay

## 2019-08-28 DIAGNOSIS — Z1231 Encounter for screening mammogram for malignant neoplasm of breast: Secondary | ICD-10-CM | POA: Diagnosis not present

## 2019-10-10 DIAGNOSIS — I10 Essential (primary) hypertension: Secondary | ICD-10-CM | POA: Diagnosis not present

## 2019-10-10 DIAGNOSIS — M158 Other polyosteoarthritis: Secondary | ICD-10-CM | POA: Diagnosis not present

## 2019-10-10 DIAGNOSIS — E119 Type 2 diabetes mellitus without complications: Secondary | ICD-10-CM | POA: Diagnosis not present

## 2019-10-10 DIAGNOSIS — J45909 Unspecified asthma, uncomplicated: Secondary | ICD-10-CM | POA: Diagnosis not present

## 2019-10-10 DIAGNOSIS — E7800 Pure hypercholesterolemia, unspecified: Secondary | ICD-10-CM | POA: Diagnosis not present

## 2019-10-16 DIAGNOSIS — E7800 Pure hypercholesterolemia, unspecified: Secondary | ICD-10-CM | POA: Diagnosis not present

## 2019-10-16 DIAGNOSIS — Z8639 Personal history of other endocrine, nutritional and metabolic disease: Secondary | ICD-10-CM | POA: Diagnosis not present

## 2019-10-16 DIAGNOSIS — I10 Essential (primary) hypertension: Secondary | ICD-10-CM | POA: Diagnosis not present

## 2019-10-16 DIAGNOSIS — Z8249 Family history of ischemic heart disease and other diseases of the circulatory system: Secondary | ICD-10-CM | POA: Diagnosis not present

## 2019-11-05 ENCOUNTER — Other Ambulatory Visit: Payer: Self-pay

## 2019-11-05 DIAGNOSIS — R002 Palpitations: Secondary | ICD-10-CM

## 2019-11-05 MED ORDER — ATENOLOL 25 MG PO TABS: 25.0000 mg | ORAL_TABLET | Freq: Every day | ORAL | 0 refills | Status: DC

## 2019-11-28 ENCOUNTER — Other Ambulatory Visit: Payer: Self-pay | Admitting: Otolaryngology

## 2019-11-28 DIAGNOSIS — H918X9 Other specified hearing loss, unspecified ear: Secondary | ICD-10-CM

## 2019-12-31 ENCOUNTER — Other Ambulatory Visit: Payer: Medicare Other

## 2020-01-02 ENCOUNTER — Other Ambulatory Visit: Payer: Self-pay | Admitting: Otolaryngology

## 2020-01-04 ENCOUNTER — Ambulatory Visit
Admission: RE | Admit: 2020-01-04 | Discharge: 2020-01-04 | Disposition: A | Discharge status: Home / Self Care | Payer: Medicare Other | Source: Ambulatory Visit | Attending: Otolaryngology | Admitting: Otolaryngology

## 2020-01-04 ENCOUNTER — Other Ambulatory Visit: Payer: Self-pay

## 2020-01-04 DIAGNOSIS — H918X9 Other specified hearing loss, unspecified ear: Secondary | ICD-10-CM

## 2020-01-04 DIAGNOSIS — R9082 White matter disease, unspecified: Secondary | ICD-10-CM | POA: Diagnosis not present

## 2020-01-04 DIAGNOSIS — G9389 Other specified disorders of brain: Secondary | ICD-10-CM | POA: Diagnosis not present

## 2020-01-04 DIAGNOSIS — R2689 Other abnormalities of gait and mobility: Secondary | ICD-10-CM | POA: Diagnosis not present

## 2020-01-04 MED ORDER — GADOBENATE DIMEGLUMINE 529 MG/ML IV SOLN
15.0000 mL | Freq: Once | INTRAVENOUS | Status: AC | PRN
  Administered 2020-01-04: 15 mL via INTRAVENOUS

## 2020-01-14 DIAGNOSIS — J310 Chronic rhinitis: Secondary | ICD-10-CM | POA: Diagnosis not present

## 2020-01-14 DIAGNOSIS — H9042 Sensorineural hearing loss, unilateral, left ear, with unrestricted hearing on the contralateral side: Secondary | ICD-10-CM | POA: Diagnosis not present

## 2020-01-14 DIAGNOSIS — R42 Dizziness and giddiness: Secondary | ICD-10-CM | POA: Diagnosis not present

## 2020-01-14 DIAGNOSIS — H9312 Tinnitus, left ear: Secondary | ICD-10-CM | POA: Diagnosis not present

## 2020-01-14 DIAGNOSIS — J343 Hypertrophy of nasal turbinates: Secondary | ICD-10-CM | POA: Diagnosis not present

## 2020-01-14 DIAGNOSIS — J342 Deviated nasal septum: Secondary | ICD-10-CM | POA: Diagnosis not present

## 2020-03-31 DIAGNOSIS — K5090 Crohn's disease, unspecified, without complications: Secondary | ICD-10-CM | POA: Diagnosis not present

## 2020-03-31 DIAGNOSIS — K5900 Constipation, unspecified: Secondary | ICD-10-CM | POA: Diagnosis not present

## 2020-04-14 ENCOUNTER — Other Ambulatory Visit: Payer: Self-pay

## 2020-04-14 ENCOUNTER — Ambulatory Visit: Payer: Medicare Other

## 2020-04-14 ENCOUNTER — Other Ambulatory Visit: Payer: Medicare Other

## 2020-04-14 DIAGNOSIS — I351 Nonrheumatic aortic (valve) insufficiency: Secondary | ICD-10-CM | POA: Diagnosis not present

## 2020-04-21 ENCOUNTER — Encounter: Payer: Self-pay | Admitting: Cardiology

## 2020-04-21 ENCOUNTER — Ambulatory Visit: Payer: Medicare Other | Admitting: Cardiology

## 2020-04-21 ENCOUNTER — Other Ambulatory Visit: Payer: Self-pay

## 2020-04-21 VITALS — BP 145/64 | HR 54 | Resp 16 | Ht 67.0 in | Wt 188.4 lb

## 2020-04-21 DIAGNOSIS — R739 Hyperglycemia, unspecified: Secondary | ICD-10-CM | POA: Diagnosis not present

## 2020-04-21 DIAGNOSIS — I351 Nonrheumatic aortic (valve) insufficiency: Secondary | ICD-10-CM

## 2020-04-21 DIAGNOSIS — R002 Palpitations: Secondary | ICD-10-CM | POA: Diagnosis not present

## 2020-04-21 DIAGNOSIS — I10 Essential (primary) hypertension: Secondary | ICD-10-CM

## 2020-04-21 MED ORDER — LOSARTAN POTASSIUM 25 MG PO TABS: 25.0000 mg | ORAL_TABLET | Freq: Every evening | ORAL | 3 refills | Status: DC

## 2020-04-21 NOTE — Progress Notes (Signed)
Primary Physician/Referring:  Norm Salt, PA  Patient ID: Carly Paul, female    DOB: 10-May-1961, 59 y.o.   MRN: [id number]  Chief Complaint  Patient presents with  . Palpitations  . Aortic Regurgitation  . Follow-up    1 year   HPI:    Carly Paul  is a 59 y.o. AAF patient  with h/o iron deficiency anemia,  hypertension, chronic palpitations and chronic dyspnea due to allergies, chronic asymptomatic bradycardia. hyperglycemia,  moderate aortic regurgitation, Crohn's disease, hypertension, history of for  MRI negative TIAs from 2009 until 2014 presenting with right leg weakness and diagnosed with frontal lobe suggestive of early manifestation of small vessel disease or could be related to previous trauma or migraine.   This is her 12 month visit. She is disabled from work due to IBS and Crohns.  States that she is doing well and no new symptoms.  Past Medical History:  Diagnosis Date  . Abnormal Pap smear of cervix 1995   had cryo done  . Allergy    sulfa and eggs  . Aortic insufficiency   . Aortic valve disorder 07/15/2018  . Arthritis    osteoarthritis  . Atypical chest pain   . Crohn's disease (HCC)   . Herniated disc   . Hyperlipidemia   . Hypertension   . IBS (irritable bowel syndrome)   . Mitral valve prolapse   . Osteoporosis   . Palpitations 07/15/2018  . Shortness of breath    Past Surgical History:  Procedure Laterality Date  . ABSCESS DRAINAGE     GI  . BLADDER SUSPENSION    . LAPAROSCOPIC SUPRACERVICAL HYSTERECTOMY    . VAGINAL HYSTERECTOMY      Social History   Tobacco Use  . Smoking status: Former Smoker    Packs/day: 0.25    Years: 1.00    Pack years: 0.25    Types: Cigarettes    Quit date: 07/12/2010    Years since quitting: 9.7  . Smokeless tobacco: Never Used  . Tobacco comment: Pt only smoked 1-2 cigarettes per day  Substance Use Topics  . Alcohol use: No  Marital Status: Single   ROS  Review of Systems    Constitutional: Negative for chills, decreased appetite, malaise/fatigue and weight gain.  Cardiovascular: Positive for dyspnea on exertion (improved) and palpitations (stable). Negative for chest pain, leg swelling and syncope.  Endocrine: Negative for cold intolerance.  Hematologic/Lymphatic: Does not bruise/bleed easily.  Musculoskeletal: Negative for joint swelling.  Gastrointestinal: Positive for change in bowel habit (IBS). Negative for abdominal pain, anorexia, hematochezia and melena.  Neurological: Negative for headaches and light-headedness.  Psychiatric/Behavioral: Negative for depression and substance abuse. The patient is nervous/anxious.   All other systems reviewed and are negative.  Objective   Vitals with BMI 04/21/2020 04/19/2019 10/16/2018  Height 5\' 7"  5\' 7"  5\' 7"   Weight 188 lbs 6 oz 177 lbs 180 lbs  BMI 29.5 27.72 28.19  Systolic 145 133  Diastolic 64 73 76  Pulse 54 47 -     Physical Exam Constitutional:      General: She is not in acute distress.    Appearance: She is well-developed.  HENT:     Head: Atraumatic.  Eyes:     Conjunctiva/sclera: Conjunctivae normal.  Neck:     Thyroid: No thyromegaly.     Vascular: No JVD.  Cardiovascular:     Rate and Rhythm: Normal rate and regular rhythm.  Pulses: Intact distal pulses.          Carotid pulses are on the right side with bruit and on the left side with bruit.    Heart sounds: Murmur heard.  Early systolic murmur is present with a grade of 2/6 at the upper right sternal border.  Blowing early diastolic murmur is present with a grade of 2/4 at the upper left sternal border.  No gallop.   Pulmonary:     Effort: Pulmonary effort is normal.     Breath sounds: Normal breath sounds.  Abdominal:     General: Bowel sounds are normal.     Palpations: Abdomen is soft.  Musculoskeletal:        General: Normal range of motion.     Cervical back: Neck supple.  Skin:    General: Skin is warm and dry.   Neurological:     Mental Status: She is alert.    Laboratory examination:   External labs:   Cholesterol, total 191.000 M 10/10/2019 HDL 71.000 MG 10/10/2019 LDL-C 45.000 MG 10/25/2018 Triglycerides 102.000 M 10/10/2019  A1C 5.900 % 10/10/2019  Hemoglobin 12.000 G/ 10/10/2019 Platelets 233.000 X1 10/10/2019   Creatinine, Serum 0.830 MG/ 10/10/2019 Potassium 3.400 mm 02/23/2018 ALT (SGPT) 18.000 IU/ 10/10/2019  Labs 10/12/2018: BUN 23, creatinine 1.29, potassium 5.5, CMP normal 7 mg.  Total cholesterol 107, triglycerides 54, HDL 47, LDL 49.  HB 2.2/HCT 87.6, platelets 243.  TSH normal.  A1c 7.3%.  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 Medications and allergies   Allergies  Allergen Reactions  . Contrast Media [Iodinated Diagnostic Agents]   . Lovastatin   . Sulfa Antibiotics Swelling  . Eggs Or Egg-Derived Products Rash     Current Outpatient Medications  Medication Instructions  . atenolol (TENORMIN) 25 mg, Oral, Daily  . levalbuterol (XOPENEX HFA) 45 MCG/ACT inhaler 1-2 puffs, Inhalation, As needed  . linaclotide (LINZESS) 290 mcg, Oral, Daily before breakfast  . loratadine (CLARITIN) 10 mg, Oral, Daily  . losartan (COZAAR) 25 mg, Oral, Every evening  . naproxen sodium (ALEVE) 220 mg, Oral, Daily PRN  . predniSONE 1 MG TBEC Oral  . Probiotic Product (PRO-BIOTIC BLEND) CAPS 1 capsule, Oral, Daily    Radiology:  No results found. Cardiac Studies:   Lexiscan myoview stress test 05/08/2018: 1. Lexiscan stress test was performed. Exercise capacity was not assessed. No stress symptoms reported. Blood pressure was normal.  The resting and stress electrocardiogram demonstrated normal sinus rhythm, normal resting conduction, no resting arrhythmias and normal rest repolarization.  Stress EKG is non diagnostic for ischemia as it is a pharmacologic stress. 2. The overall quality of the study is good.  Left  ventricular cavity is noted to be normal on the rest and stress studies.  Gated SPECT images reveal normal myocardial thickening and wall motion.  The left ventricular ejection fraction was calculated or visually estimated to be 53%. Medium sized, moderate intensity, predominantly reversible perfusion defect in mid to basal inferior/Inferoseptal myocardium suggesting medium ischemia 3. Intermediate risk study.  Holter Monitor 48 hours 04/12/2018: Predominant rhythm is normal sinus rhythm. Maximum heart rate 132 bpm, minimum heart rate 42 bpm. Occasional PACs were noted. Occasional atrial couplets were noted. Symptoms of palpitations and irregular heartbeat revealed normal sinus rhythm. No other significant arrhythmias. No heart block  Carotid artery duplex  08/09/2018 Minimal stenosis in the left internal carotid artery (1-15%). Right carotid appears normal.  Antegrade right vertebral artery  flow. Antegrade left vertebral artery flow.  Echocardiogram 04/14/2020: Left ventricle cavity is normal in size and wall thickness. Normal global wall motion. Normal LV systolic function with EF 55%. Doppler evidence of grade I (impaired) diastolic dysfunction, normal LAP.  Probably tricuspid valve with mild thickening.  Moderate (grade II) posterior directed aortic regurgitation. Trace aortic stenosis. Normal right atrial pressure. No significant change compared to previous study in 2019.   EKG:   EKG 04/21/2020: Sinus bradycardia with borderline short PR interval at rate of 51 bpm, normal axis, no evidence of ischemia otherwise normal EKG. no significant change from 04/19/2019.  Assessment     ICD-10-CM   1. Palpitations  R00.2 EKG 12-Lead  2. Moderate aortic regurgitation  I35.1 PCV ECHOCARDIOGRAM COMPLETE  3. Essential hypertension  I10 losartan (COZAAR) 25 MG tablet    Basic metabolic panel    PCV ECHOCARDIOGRAM COMPLETE  4. Hyperglycemia  R73.9     Meds ordered this encounter  Medications  .  losartan (COZAAR) 25 MG tablet    Sig: Take 1 tablet (25 mg total) by mouth every evening.    Dispense:  90 tablet    Refill:  3  There are no discontinued medications.    Recommendations:    Carly Paul  is a 59 y.o.  AAF patient  with h/o iron deficiency anemia,  hypertension, chronic palpitations and chronic dyspnea due to allergies, chronic asymptomatic bradycardia. hyperglycemia,  moderate aortic regurgitation, Crohn's disease, hypertension, history of for  MRI negative TIAs from 2009 until 2014 presenting with right leg weakness and diagnosed with frontal lobe suggestive of early manifestation of small vessel disease or could be related to previous trauma or migraine.  This is her 6 month visit to evaluate chest pain and palpitations.  This is annual visit, symptoms of palpitations are stable since being on low-dose beta-blocker, although has bradycardia with moderate AI, she has done the best she has in quite a while.  Her systolic blood pressure is slightly elevated today and she is also prediabetic with hyperglycemia, external labs reviewed, I have added losartan 25 mg in the evening and she will obtain a BMP in 2 weeks.  No change in physical exam, aortic regurgitation has remained stable.  We will repeat echocardiogram in 1 year and see her back at that time.  On a prior office visit, she had A1c in the diabetes range, recent labs suggest only hyperglycemia.  She has discontinued atorvastatin and states that since stopping the atorvastatin she has felt better.  In spite of not being on a statin, lipids are under control.   Carly Decamp, MD, [facility] 04/21/2020, 9:41 AM Office: [phone] Pager: [phone]

## 2020-04-29 ENCOUNTER — Other Ambulatory Visit: Payer: Self-pay | Admitting: Cardiology

## 2020-05-12 ENCOUNTER — Other Ambulatory Visit: Payer: Self-pay

## 2020-05-12 DIAGNOSIS — I10 Essential (primary) hypertension: Secondary | ICD-10-CM | POA: Diagnosis not present

## 2020-05-12 MED ORDER — ATENOLOL 25 MG PO TABS: 25.0000 mg | ORAL_TABLET | Freq: Every day | ORAL | 1 refills | Status: DC

## 2020-05-13 LAB — BASIC METABOLIC PANEL
BUN/Creatinine Ratio: 12 (ref 9–23)
BUN: 11 mg/dL (ref 6–24)
CO2: 23 mmol/L (ref 20–29)
Calcium: 9.3 mg/dL (ref 8.7–10.2)
Chloride: 105 mmol/L (ref 96–106)
Creatinine, Ser: 0.92 mg/dL (ref 0.57–1.00)
GFR calc Af Amer: 79 mL/min/{1.73_m2} (ref >59)
GFR calc non Af Amer: 68 mL/min/{1.73_m2} (ref >59)
Glucose: 90 mg/dL (ref 65–99)
Potassium: 4.1 mmol/L (ref 3.5–5.2)
Sodium: 143 mmol/L (ref 134–144)

## 2020-05-13 NOTE — Progress Notes (Signed)
Normal BMP

## 2020-07-24 ENCOUNTER — Other Ambulatory Visit: Payer: Self-pay | Admitting: Physician Assistant

## 2020-07-24 DIAGNOSIS — Z1231 Encounter for screening mammogram for malignant neoplasm of breast: Secondary | ICD-10-CM

## 2020-08-11 DIAGNOSIS — K0510 Chronic gingivitis, plaque induced: Secondary | ICD-10-CM | POA: Diagnosis not present

## 2020-08-11 DIAGNOSIS — E7800 Pure hypercholesterolemia, unspecified: Secondary | ICD-10-CM | POA: Diagnosis not present

## 2020-08-11 DIAGNOSIS — E119 Type 2 diabetes mellitus without complications: Secondary | ICD-10-CM | POA: Diagnosis not present

## 2020-08-11 DIAGNOSIS — K219 Gastro-esophageal reflux disease without esophagitis: Secondary | ICD-10-CM | POA: Diagnosis not present

## 2020-08-11 DIAGNOSIS — I10 Essential (primary) hypertension: Secondary | ICD-10-CM | POA: Diagnosis not present

## 2020-08-11 DIAGNOSIS — M158 Other polyosteoarthritis: Secondary | ICD-10-CM | POA: Diagnosis not present

## 2020-08-11 DIAGNOSIS — Z0001 Encounter for general adult medical examination with abnormal findings: Secondary | ICD-10-CM | POA: Diagnosis not present

## 2020-08-11 DIAGNOSIS — J45909 Unspecified asthma, uncomplicated: Secondary | ICD-10-CM | POA: Diagnosis not present

## 2020-09-15 ENCOUNTER — Ambulatory Visit
Admission: RE | Admit: 2020-09-15 | Discharge: 2020-09-15 | Disposition: A | Discharge status: Home / Self Care | Payer: Medicare Other | Source: Ambulatory Visit | Attending: Physician Assistant | Admitting: Physician Assistant

## 2020-09-15 ENCOUNTER — Other Ambulatory Visit: Payer: Self-pay

## 2020-09-15 DIAGNOSIS — Z1231 Encounter for screening mammogram for malignant neoplasm of breast: Secondary | ICD-10-CM

## 2021-02-08 ENCOUNTER — Other Ambulatory Visit: Payer: Self-pay | Admitting: Cardiology

## 2021-02-09 ENCOUNTER — Other Ambulatory Visit: Payer: Self-pay

## 2021-02-09 MED ORDER — ATENOLOL 25 MG PO TABS: 25.0000 mg | ORAL_TABLET | Freq: Every day | ORAL | 3 refills | Status: AC

## 2021-02-18 DIAGNOSIS — M6740 Ganglion, unspecified site: Secondary | ICD-10-CM | POA: Diagnosis not present

## 2021-03-02 DIAGNOSIS — R2231 Localized swelling, mass and lump, right upper limb: Secondary | ICD-10-CM | POA: Diagnosis not present

## 2021-03-30 IMAGING — MR MR HEAD WO/W CM
11 of 12 series · 42 of 48 positions shown · IV contrast (15 ml multihance)
Comparison: 10/11/2012

CLINICAL DATA: Off balance with left-sided ear ringing for 3
months.

EXAM:
MRI HEAD WITHOUT AND WITH CONTRAST
TECHNIQUE: Multiplanar, multiecho pulse sequences of the brain and surrounding
structures were obtained without and with intravenous contrast.
CONTRAST:  15mL MULTIHANCE GADOBENATE DIMEGLUMINE 529 MG/ML IV SOLN

[Series 9: T1 · sagittal · 4.0mm · 0.72mm/px · 3 of 26 slices shown (1 of 3)]
[im 1/26]
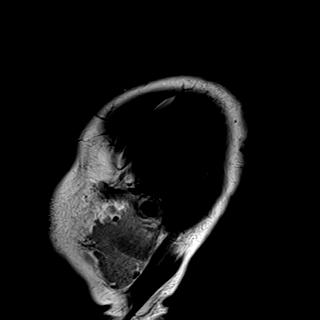
[im 13/26]
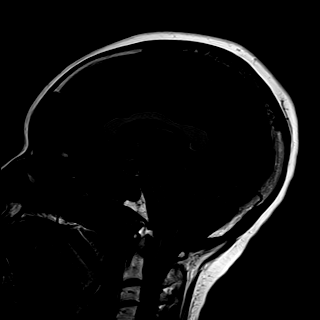
[im 26/26]
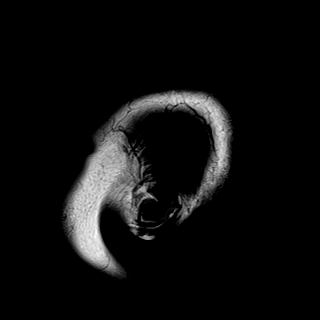

[Series 10: DWI · axial · 3.0mm · 1.44mm/px · z∈[-77,+73]mm · 7 of 80 slices shown (1 of 2)]
[im 1/80]
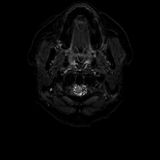
[im 14/80]
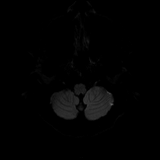
[im 27/80]
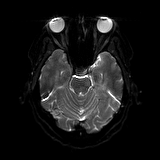
[im 40/80]
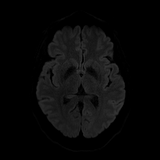
[im 53/80]
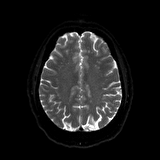
[im 66/80]
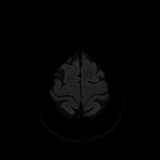
[im 80/80]
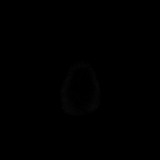

[Series 11: DWI · axial · 3.0mm · 1.44mm/px · z∈[-77,+73]mm · 4 of 40 slices shown (2 of 2)]
[im 1/40]
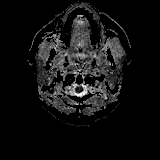
[im 14/40]
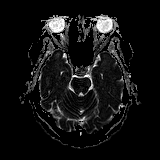
[im 27/40]
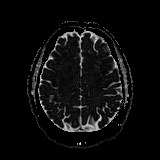
[im 40/40]
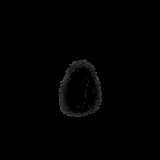

[Series 12: T2 · axial · 4.0mm · 0.36mm/px · z∈[-73,+65]mm · 2 of 27 slices shown]
[im 1/27]
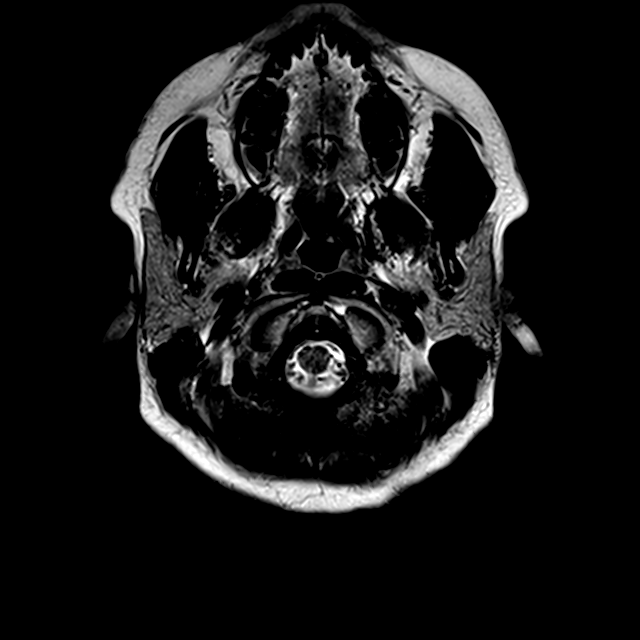
[im 27/27]
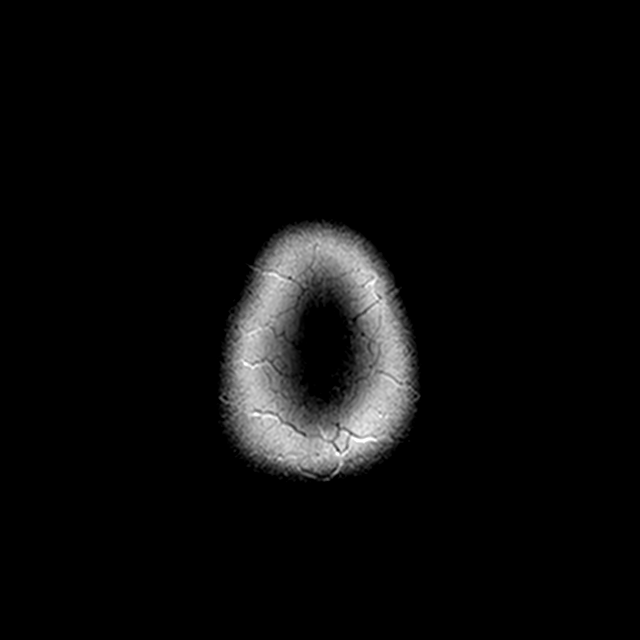

[Series 13: FLAIR · axial · 3.0mm · 0.72mm/px · z∈[-83,+76]mm · 2 of 28 slices shown]
[im 1/28]
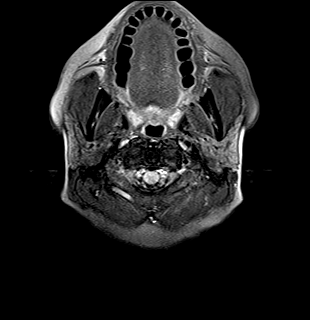
[im 28/28]
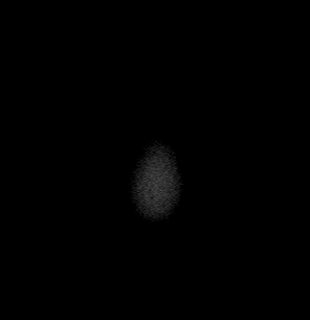

[Series 15: swi_images · axial · 2.2mm · 0.90mm/px · z∈[-70,+66]mm · 6 of 64 slices shown]
[im 1/64]
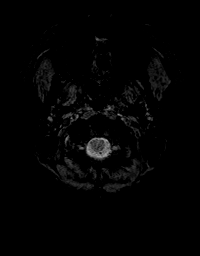
[im 13/64]
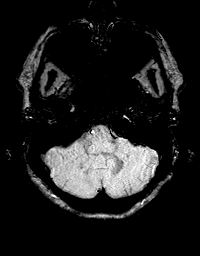
[im 26/64]
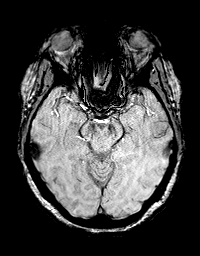
[im 38/64]
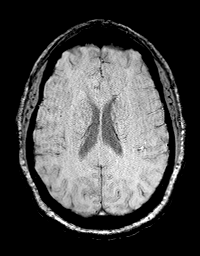
[im 51/64]
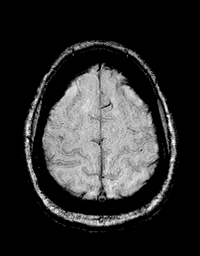
[im 64/64]
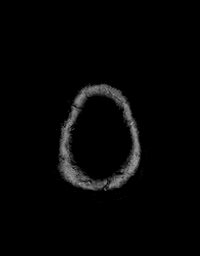

[Series 16: T1 · coronal · 2.5mm · 0.56mm/px · 1 of 13 slices shown (2 of 3)]
[im 1/13]
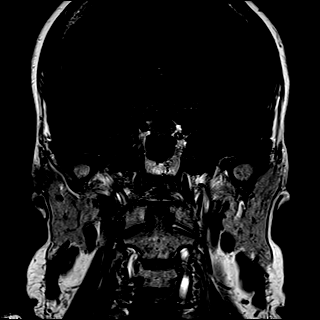

[Series 17: T1 · axial · 2.5mm · 0.50mm/px · 1 of 13 slices shown (3 of 3)]
[im 1/13]
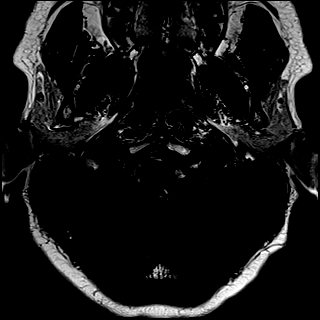

[Series 19: T1 post-contrast · coronal · 2.5mm · 0.56mm/px · 1 of 13 slices shown (1 of 3)]
[im 1/13]
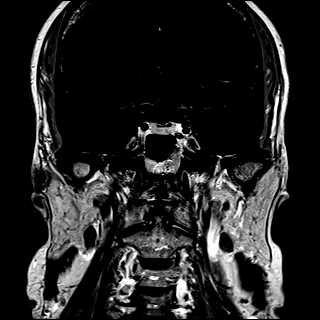

[Series 20: T1 post-contrast · axial · 2.5mm · 0.50mm/px · 1 of 13 slices shown (2 of 3)]
[im 1/13]
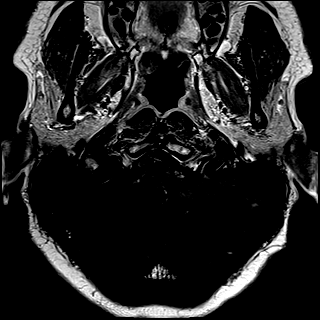

[Series 21: T1 post-contrast · axial · 1.0mm · 0.90mm/px · z∈[-80,+76]mm · 14 of 160 slices shown (3 of 3)]
[im 1/160]
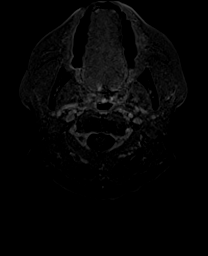
[im 13/160]
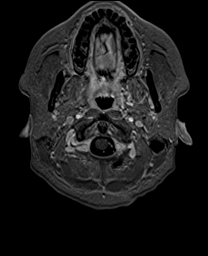
[im 25/160]
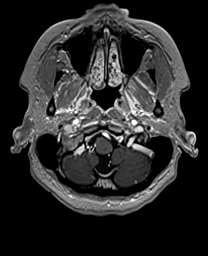
[im 37/160]
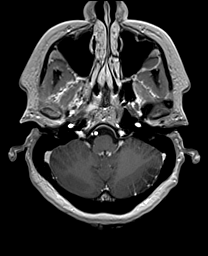
[im 49/160]
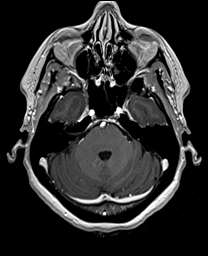
[im 62/160]
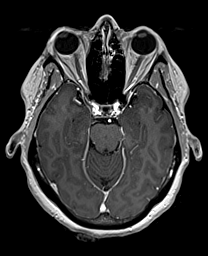
[im 74/160]
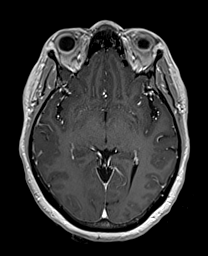
[im 86/160]
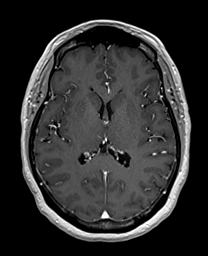
[im 98/160]
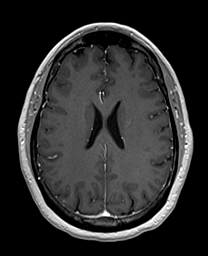
[im 111/160]
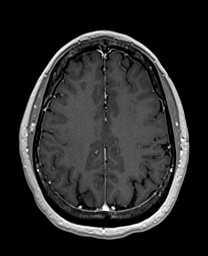
[im 123/160]
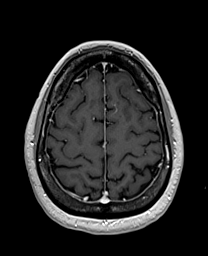
[im 135/160]
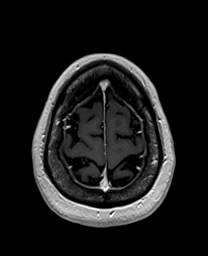
[im 147/160]
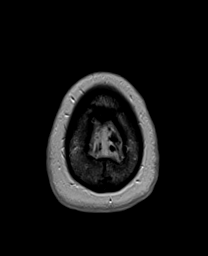
[im 160/160]
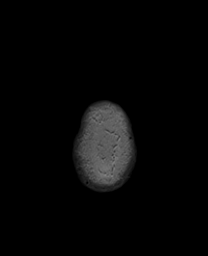

[42 of 48 positions shown; findings below may reference images not displayed]

FINDINGS: Brain: Normal labyrinthine signal. No vestibular, cisternal, or
canalicular abnormality detected. Remote white matter insults in the
cerebral hemispheres from nonspecific cause, not clearly progressed
from 0747. No infarct, hydrocephalus, or collection.

Vascular: Normal flow voids and vascular enhancements. No evidence
of dural sinus stenosis or diverticulum.

Skull and upper cervical spine: Normal marrow signal

Sinuses/Orbits: Negative
IMPRESSION: 1. Negative for retrocochlear lesion or other cause of symptoms.
2. Remote white matter insults with a nonspecific pattern,
nonprogressive compared to 0747.

## 2021-03-31 ENCOUNTER — Other Ambulatory Visit: Payer: Medicare Other

## 2021-04-03 ENCOUNTER — Ambulatory Visit: Payer: Medicare Other | Admitting: Cardiology

## 2021-04-09 ENCOUNTER — Ambulatory Visit: Payer: Medicare Other | Admitting: Cardiology

## 2021-04-13 ENCOUNTER — Other Ambulatory Visit: Payer: Self-pay | Admitting: Cardiology

## 2021-04-13 DIAGNOSIS — I10 Essential (primary) hypertension: Secondary | ICD-10-CM

## 2021-04-29 DIAGNOSIS — Z23 Encounter for immunization: Secondary | ICD-10-CM | POA: Diagnosis not present

## 2021-04-30 ENCOUNTER — Telehealth: Payer: Self-pay | Admitting: Cardiology

## 2021-04-30 NOTE — Telephone Encounter (Signed)
LVM to r/s echo

## 2021-05-04 ENCOUNTER — Other Ambulatory Visit: Payer: Self-pay | Admitting: Cardiology

## 2021-05-04 DIAGNOSIS — I10 Essential (primary) hypertension: Secondary | ICD-10-CM

## 2021-05-05 ENCOUNTER — Other Ambulatory Visit: Payer: Self-pay
